# Patient Record
Sex: Female | Born: 1955
Health system: Southern US, Community
[De-identification: ages and names within clinical notes are randomized; demographics above are authoritative.]

## PROBLEM LIST (undated history)

## (undated) DIAGNOSIS — K219 Gastro-esophageal reflux disease without esophagitis: Secondary | ICD-10-CM

## (undated) DIAGNOSIS — I10 Essential (primary) hypertension: Secondary | ICD-10-CM

## (undated) DIAGNOSIS — J45909 Unspecified asthma, uncomplicated: Secondary | ICD-10-CM

## (undated) DIAGNOSIS — T7840XA Allergy, unspecified, initial encounter: Secondary | ICD-10-CM

## (undated) HISTORY — DX: Unspecified asthma, uncomplicated: J45.909

## (undated) HISTORY — DX: Allergy, unspecified, initial encounter: T78.40XA

## (undated) HISTORY — PX: BRAIN SURGERY: SHX531

## (undated) HISTORY — DX: Gastro-esophageal reflux disease without esophagitis: K21.9

---

## 2007-12-15 ENCOUNTER — Other Ambulatory Visit: Admission: RE | Admit: 2007-12-15 | Discharge: 2007-12-15 | Payer: Self-pay | Admitting: Internal Medicine

## 2010-11-19 ENCOUNTER — Encounter: Payer: Self-pay | Admitting: Internal Medicine

## 2014-09-01 ENCOUNTER — Other Ambulatory Visit: Payer: Self-pay | Admitting: Otolaryngology

## 2014-09-01 DIAGNOSIS — D170 Benign lipomatous neoplasm of skin and subcutaneous tissue of head, face and neck: Secondary | ICD-10-CM

## 2014-09-07 ENCOUNTER — Other Ambulatory Visit: Payer: Self-pay

## 2014-09-08 ENCOUNTER — Ambulatory Visit
Admission: RE | Admit: 2014-09-08 | Discharge: 2014-09-08 | Disposition: A | Discharge status: Home / Self Care | Payer: 59 | Source: Ambulatory Visit | Attending: Otolaryngology | Admitting: Otolaryngology

## 2014-09-08 DIAGNOSIS — D170 Benign lipomatous neoplasm of skin and subcutaneous tissue of head, face and neck: Secondary | ICD-10-CM

## 2014-09-08 MED ORDER — IOHEXOL 300 MG/ML  SOLN
75.0000 mL | Freq: Once | INTRAMUSCULAR | Status: AC | PRN
  Administered 2014-09-08: 75 mL via INTRAVENOUS

## 2015-12-02 ENCOUNTER — Other Ambulatory Visit: Payer: Self-pay | Admitting: Physical Medicine and Rehabilitation

## 2015-12-02 DIAGNOSIS — M5417 Radiculopathy, lumbosacral region: Secondary | ICD-10-CM

## 2015-12-08 ENCOUNTER — Ambulatory Visit
Admission: RE | Admit: 2015-12-08 | Discharge: 2015-12-08 | Disposition: A | Discharge status: Home / Self Care | Payer: 59 | Source: Ambulatory Visit | Attending: Physical Medicine and Rehabilitation | Admitting: Physical Medicine and Rehabilitation

## 2015-12-08 DIAGNOSIS — M5417 Radiculopathy, lumbosacral region: Secondary | ICD-10-CM

## 2016-03-05 DIAGNOSIS — M5416 Radiculopathy, lumbar region: Secondary | ICD-10-CM | POA: Diagnosis not present

## 2016-03-13 DIAGNOSIS — M5416 Radiculopathy, lumbar region: Secondary | ICD-10-CM | POA: Diagnosis not present

## 2016-03-22 DIAGNOSIS — M47816 Spondylosis without myelopathy or radiculopathy, lumbar region: Secondary | ICD-10-CM | POA: Diagnosis not present

## 2016-03-22 DIAGNOSIS — M4316 Spondylolisthesis, lumbar region: Secondary | ICD-10-CM | POA: Diagnosis not present

## 2016-03-22 DIAGNOSIS — M5126 Other intervertebral disc displacement, lumbar region: Secondary | ICD-10-CM | POA: Diagnosis not present

## 2016-03-29 DIAGNOSIS — M5416 Radiculopathy, lumbar region: Secondary | ICD-10-CM | POA: Diagnosis not present

## 2016-05-03 DIAGNOSIS — M5416 Radiculopathy, lumbar region: Secondary | ICD-10-CM | POA: Diagnosis not present

## 2016-05-07 DIAGNOSIS — M47816 Spondylosis without myelopathy or radiculopathy, lumbar region: Secondary | ICD-10-CM | POA: Diagnosis not present

## 2016-05-07 DIAGNOSIS — M5126 Other intervertebral disc displacement, lumbar region: Secondary | ICD-10-CM | POA: Diagnosis not present

## 2016-05-07 DIAGNOSIS — M4316 Spondylolisthesis, lumbar region: Secondary | ICD-10-CM | POA: Diagnosis not present

## 2016-05-07 DIAGNOSIS — Z6833 Body mass index (BMI) 33.0-33.9, adult: Secondary | ICD-10-CM | POA: Diagnosis not present

## 2016-05-15 DIAGNOSIS — M5416 Radiculopathy, lumbar region: Secondary | ICD-10-CM | POA: Diagnosis not present

## 2016-05-24 DIAGNOSIS — M5416 Radiculopathy, lumbar region: Secondary | ICD-10-CM | POA: Diagnosis not present

## 2016-06-12 DIAGNOSIS — M5416 Radiculopathy, lumbar region: Secondary | ICD-10-CM | POA: Diagnosis not present

## 2016-07-19 DIAGNOSIS — M47816 Spondylosis without myelopathy or radiculopathy, lumbar region: Secondary | ICD-10-CM | POA: Diagnosis not present

## 2016-07-19 DIAGNOSIS — M5416 Radiculopathy, lumbar region: Secondary | ICD-10-CM | POA: Diagnosis not present

## 2016-07-19 DIAGNOSIS — M4726 Other spondylosis with radiculopathy, lumbar region: Secondary | ICD-10-CM | POA: Diagnosis not present

## 2016-07-19 DIAGNOSIS — M4316 Spondylolisthesis, lumbar region: Secondary | ICD-10-CM | POA: Diagnosis not present

## 2016-07-23 DIAGNOSIS — I10 Essential (primary) hypertension: Secondary | ICD-10-CM | POA: Diagnosis not present

## 2016-08-23 DIAGNOSIS — Z1231 Encounter for screening mammogram for malignant neoplasm of breast: Secondary | ICD-10-CM | POA: Diagnosis not present

## 2017-01-29 DIAGNOSIS — J22 Unspecified acute lower respiratory infection: Secondary | ICD-10-CM | POA: Diagnosis not present

## 2018-05-23 DIAGNOSIS — M4316 Spondylolisthesis, lumbar region: Secondary | ICD-10-CM | POA: Diagnosis not present

## 2018-05-23 DIAGNOSIS — M47816 Spondylosis without myelopathy or radiculopathy, lumbar region: Secondary | ICD-10-CM | POA: Diagnosis not present

## 2018-05-23 DIAGNOSIS — M5416 Radiculopathy, lumbar region: Secondary | ICD-10-CM | POA: Diagnosis not present

## 2018-05-23 DIAGNOSIS — M5126 Other intervertebral disc displacement, lumbar region: Secondary | ICD-10-CM | POA: Diagnosis not present

## 2018-06-12 DIAGNOSIS — M5126 Other intervertebral disc displacement, lumbar region: Secondary | ICD-10-CM | POA: Diagnosis not present

## 2018-06-12 DIAGNOSIS — M4316 Spondylolisthesis, lumbar region: Secondary | ICD-10-CM | POA: Diagnosis not present

## 2018-06-12 DIAGNOSIS — M5416 Radiculopathy, lumbar region: Secondary | ICD-10-CM | POA: Diagnosis not present

## 2018-06-12 DIAGNOSIS — M47816 Spondylosis without myelopathy or radiculopathy, lumbar region: Secondary | ICD-10-CM | POA: Diagnosis not present

## 2018-06-26 DIAGNOSIS — M5126 Other intervertebral disc displacement, lumbar region: Secondary | ICD-10-CM | POA: Diagnosis not present

## 2018-07-10 DIAGNOSIS — M4726 Other spondylosis with radiculopathy, lumbar region: Secondary | ICD-10-CM | POA: Diagnosis not present

## 2018-07-10 DIAGNOSIS — M5416 Radiculopathy, lumbar region: Secondary | ICD-10-CM | POA: Diagnosis not present

## 2018-07-10 DIAGNOSIS — M4316 Spondylolisthesis, lumbar region: Secondary | ICD-10-CM | POA: Diagnosis not present

## 2018-07-10 DIAGNOSIS — M47816 Spondylosis without myelopathy or radiculopathy, lumbar region: Secondary | ICD-10-CM | POA: Diagnosis not present

## 2018-07-11 DIAGNOSIS — M5126 Other intervertebral disc displacement, lumbar region: Secondary | ICD-10-CM | POA: Diagnosis not present

## 2018-07-16 DIAGNOSIS — M5126 Other intervertebral disc displacement, lumbar region: Secondary | ICD-10-CM | POA: Diagnosis not present

## 2018-07-25 DIAGNOSIS — Z23 Encounter for immunization: Secondary | ICD-10-CM | POA: Diagnosis not present

## 2018-07-30 DIAGNOSIS — I10 Essential (primary) hypertension: Secondary | ICD-10-CM | POA: Diagnosis not present

## 2018-07-30 DIAGNOSIS — M5126 Other intervertebral disc displacement, lumbar region: Secondary | ICD-10-CM | POA: Diagnosis not present

## 2018-08-06 DIAGNOSIS — E6609 Other obesity due to excess calories: Secondary | ICD-10-CM | POA: Diagnosis not present

## 2018-08-06 DIAGNOSIS — I10 Essential (primary) hypertension: Secondary | ICD-10-CM | POA: Diagnosis not present

## 2018-08-06 DIAGNOSIS — E782 Mixed hyperlipidemia: Secondary | ICD-10-CM | POA: Diagnosis not present

## 2018-08-21 DIAGNOSIS — M4316 Spondylolisthesis, lumbar region: Secondary | ICD-10-CM | POA: Diagnosis not present

## 2018-08-21 DIAGNOSIS — M5126 Other intervertebral disc displacement, lumbar region: Secondary | ICD-10-CM | POA: Diagnosis not present

## 2018-08-21 DIAGNOSIS — M4726 Other spondylosis with radiculopathy, lumbar region: Secondary | ICD-10-CM | POA: Diagnosis not present

## 2018-08-21 DIAGNOSIS — M47816 Spondylosis without myelopathy or radiculopathy, lumbar region: Secondary | ICD-10-CM | POA: Diagnosis not present

## 2018-10-26 DIAGNOSIS — S161XXA Strain of muscle, fascia and tendon at neck level, initial encounter: Secondary | ICD-10-CM | POA: Diagnosis not present

## 2018-11-17 DIAGNOSIS — H2513 Age-related nuclear cataract, bilateral: Secondary | ICD-10-CM | POA: Diagnosis not present

## 2018-12-04 DIAGNOSIS — J029 Acute pharyngitis, unspecified: Secondary | ICD-10-CM | POA: Diagnosis not present

## 2018-12-04 DIAGNOSIS — R05 Cough: Secondary | ICD-10-CM | POA: Diagnosis not present

## 2019-01-07 DIAGNOSIS — I10 Essential (primary) hypertension: Secondary | ICD-10-CM | POA: Diagnosis not present

## 2019-01-07 DIAGNOSIS — E782 Mixed hyperlipidemia: Secondary | ICD-10-CM | POA: Diagnosis not present

## 2019-01-07 DIAGNOSIS — E6609 Other obesity due to excess calories: Secondary | ICD-10-CM | POA: Diagnosis not present

## 2019-01-07 DIAGNOSIS — Z131 Encounter for screening for diabetes mellitus: Secondary | ICD-10-CM | POA: Diagnosis not present

## 2019-02-17 DIAGNOSIS — J988 Other specified respiratory disorders: Secondary | ICD-10-CM | POA: Diagnosis not present

## 2019-02-27 DIAGNOSIS — J988 Other specified respiratory disorders: Secondary | ICD-10-CM | POA: Diagnosis not present

## 2019-02-27 DIAGNOSIS — I10 Essential (primary) hypertension: Secondary | ICD-10-CM | POA: Diagnosis not present

## 2019-02-27 DIAGNOSIS — R0602 Shortness of breath: Secondary | ICD-10-CM | POA: Diagnosis not present

## 2019-03-03 DIAGNOSIS — Z7189 Other specified counseling: Secondary | ICD-10-CM | POA: Diagnosis not present

## 2019-03-10 DIAGNOSIS — R0602 Shortness of breath: Secondary | ICD-10-CM | POA: Diagnosis not present

## 2019-03-10 DIAGNOSIS — J988 Other specified respiratory disorders: Secondary | ICD-10-CM | POA: Diagnosis not present

## 2019-03-10 DIAGNOSIS — Z7189 Other specified counseling: Secondary | ICD-10-CM | POA: Diagnosis not present

## 2019-03-10 DIAGNOSIS — I10 Essential (primary) hypertension: Secondary | ICD-10-CM | POA: Diagnosis not present

## 2019-03-17 DIAGNOSIS — J111 Influenza due to unidentified influenza virus with other respiratory manifestations: Secondary | ICD-10-CM | POA: Diagnosis not present

## 2019-03-17 DIAGNOSIS — I10 Essential (primary) hypertension: Secondary | ICD-10-CM | POA: Diagnosis not present

## 2019-03-17 DIAGNOSIS — J329 Chronic sinusitis, unspecified: Secondary | ICD-10-CM | POA: Diagnosis not present

## 2019-03-17 DIAGNOSIS — Z20828 Contact with and (suspected) exposure to other viral communicable diseases: Secondary | ICD-10-CM | POA: Diagnosis not present

## 2019-03-17 DIAGNOSIS — R05 Cough: Secondary | ICD-10-CM | POA: Diagnosis not present

## 2019-03-18 DIAGNOSIS — Z20828 Contact with and (suspected) exposure to other viral communicable diseases: Secondary | ICD-10-CM | POA: Diagnosis not present

## 2019-05-26 ENCOUNTER — Other Ambulatory Visit: Payer: Self-pay | Admitting: Family

## 2019-05-26 ENCOUNTER — Other Ambulatory Visit: Payer: Self-pay

## 2019-05-26 DIAGNOSIS — Z20822 Contact with and (suspected) exposure to covid-19: Secondary | ICD-10-CM

## 2019-05-26 DIAGNOSIS — R6889 Other general symptoms and signs: Secondary | ICD-10-CM | POA: Diagnosis not present

## 2019-05-28 ENCOUNTER — Other Ambulatory Visit: Payer: Self-pay

## 2019-05-28 DIAGNOSIS — R6889 Other general symptoms and signs: Secondary | ICD-10-CM | POA: Diagnosis not present

## 2019-05-28 DIAGNOSIS — Z20822 Contact with and (suspected) exposure to covid-19: Secondary | ICD-10-CM

## 2019-05-28 LAB — NOVEL CORONAVIRUS, NAA: SARS-CoV-2, NAA: NOT DETECTED

## 2019-05-30 LAB — NOVEL CORONAVIRUS, NAA: SARS-CoV-2, NAA: NOT DETECTED

## 2019-06-26 DIAGNOSIS — Z23 Encounter for immunization: Secondary | ICD-10-CM | POA: Diagnosis not present

## 2019-09-11 ENCOUNTER — Other Ambulatory Visit: Payer: Self-pay

## 2019-09-11 DIAGNOSIS — Z20822 Contact with and (suspected) exposure to covid-19: Secondary | ICD-10-CM

## 2019-09-14 LAB — NOVEL CORONAVIRUS, NAA: SARS-CoV-2, NAA: NOT DETECTED

## 2019-10-09 ENCOUNTER — Other Ambulatory Visit: Payer: Self-pay | Admitting: Internal Medicine

## 2019-10-09 DIAGNOSIS — Z1231 Encounter for screening mammogram for malignant neoplasm of breast: Secondary | ICD-10-CM

## 2020-02-10 DIAGNOSIS — Z0000 Encounter for general adult medical examination without abnormal findings: Secondary | ICD-10-CM | POA: Diagnosis not present

## 2020-02-10 DIAGNOSIS — N390 Urinary tract infection, site not specified: Secondary | ICD-10-CM | POA: Diagnosis not present

## 2020-02-10 DIAGNOSIS — E7800 Pure hypercholesterolemia, unspecified: Secondary | ICD-10-CM | POA: Diagnosis not present

## 2020-02-19 DIAGNOSIS — I10 Essential (primary) hypertension: Secondary | ICD-10-CM | POA: Diagnosis not present

## 2020-02-19 DIAGNOSIS — Z0000 Encounter for general adult medical examination without abnormal findings: Secondary | ICD-10-CM | POA: Diagnosis not present

## 2020-02-19 DIAGNOSIS — R430 Anosmia: Secondary | ICD-10-CM | POA: Diagnosis not present

## 2020-02-19 DIAGNOSIS — E782 Mixed hyperlipidemia: Secondary | ICD-10-CM | POA: Diagnosis not present

## 2020-02-19 DIAGNOSIS — E6609 Other obesity due to excess calories: Secondary | ICD-10-CM | POA: Diagnosis not present

## 2020-02-19 DIAGNOSIS — F5101 Primary insomnia: Secondary | ICD-10-CM | POA: Diagnosis not present

## 2020-07-22 DIAGNOSIS — Z23 Encounter for immunization: Secondary | ICD-10-CM | POA: Diagnosis not present

## 2020-08-09 DIAGNOSIS — I10 Essential (primary) hypertension: Secondary | ICD-10-CM | POA: Diagnosis not present

## 2020-10-03 DIAGNOSIS — R519 Headache, unspecified: Secondary | ICD-10-CM | POA: Diagnosis not present

## 2020-10-03 DIAGNOSIS — R110 Nausea: Secondary | ICD-10-CM | POA: Diagnosis not present

## 2020-10-03 DIAGNOSIS — J029 Acute pharyngitis, unspecified: Secondary | ICD-10-CM | POA: Diagnosis not present

## 2020-10-03 DIAGNOSIS — R0981 Nasal congestion: Secondary | ICD-10-CM | POA: Diagnosis not present

## 2020-10-07 ENCOUNTER — Inpatient Hospital Stay (HOSPITAL_COMMUNITY)
Admission: EM | Admit: 2020-10-07 | Discharge: 2020-10-13 | DRG: 614 | Disposition: A | Discharge status: Home / Self Care | Payer: BC Managed Care – PPO | Attending: Neurological Surgery | Admitting: Neurological Surgery

## 2020-10-07 ENCOUNTER — Encounter (HOSPITAL_COMMUNITY): Payer: Self-pay | Admitting: Emergency Medicine

## 2020-10-07 ENCOUNTER — Other Ambulatory Visit: Payer: Self-pay

## 2020-10-07 ENCOUNTER — Emergency Department (HOSPITAL_COMMUNITY): Payer: BC Managed Care – PPO

## 2020-10-07 DIAGNOSIS — R1110 Vomiting, unspecified: Secondary | ICD-10-CM | POA: Diagnosis not present

## 2020-10-07 DIAGNOSIS — D352 Benign neoplasm of pituitary gland: Secondary | ICD-10-CM | POA: Diagnosis present

## 2020-10-07 DIAGNOSIS — E876 Hypokalemia: Secondary | ICD-10-CM | POA: Diagnosis not present

## 2020-10-07 DIAGNOSIS — Z20822 Contact with and (suspected) exposure to covid-19: Secondary | ICD-10-CM | POA: Diagnosis present

## 2020-10-07 DIAGNOSIS — E2749 Other adrenocortical insufficiency: Secondary | ICD-10-CM | POA: Diagnosis present

## 2020-10-07 DIAGNOSIS — J3489 Other specified disorders of nose and nasal sinuses: Secondary | ICD-10-CM | POA: Diagnosis not present

## 2020-10-07 DIAGNOSIS — Z881 Allergy status to other antibiotic agents status: Secondary | ICD-10-CM

## 2020-10-07 DIAGNOSIS — H2513 Age-related nuclear cataract, bilateral: Secondary | ICD-10-CM | POA: Diagnosis not present

## 2020-10-07 DIAGNOSIS — Z01818 Encounter for other preprocedural examination: Secondary | ICD-10-CM | POA: Diagnosis not present

## 2020-10-07 DIAGNOSIS — K219 Gastro-esophageal reflux disease without esophagitis: Secondary | ICD-10-CM | POA: Diagnosis not present

## 2020-10-07 DIAGNOSIS — E222 Syndrome of inappropriate secretion of antidiuretic hormone: Secondary | ICD-10-CM | POA: Diagnosis present

## 2020-10-07 DIAGNOSIS — R519 Headache, unspecified: Secondary | ICD-10-CM | POA: Diagnosis not present

## 2020-10-07 DIAGNOSIS — E236 Other disorders of pituitary gland: Principal | ICD-10-CM | POA: Diagnosis present

## 2020-10-07 DIAGNOSIS — G9389 Other specified disorders of brain: Secondary | ICD-10-CM | POA: Diagnosis not present

## 2020-10-07 DIAGNOSIS — I10 Essential (primary) hypertension: Secondary | ICD-10-CM | POA: Diagnosis present

## 2020-10-07 DIAGNOSIS — Z885 Allergy status to narcotic agent status: Secondary | ICD-10-CM | POA: Diagnosis not present

## 2020-10-07 DIAGNOSIS — H53469 Homonymous bilateral field defects, unspecified side: Secondary | ICD-10-CM | POA: Diagnosis not present

## 2020-10-07 DIAGNOSIS — J342 Deviated nasal septum: Secondary | ICD-10-CM | POA: Diagnosis not present

## 2020-10-07 DIAGNOSIS — H5347 Heteronymous bilateral field defects: Secondary | ICD-10-CM | POA: Diagnosis present

## 2020-10-07 DIAGNOSIS — E871 Hypo-osmolality and hyponatremia: Secondary | ICD-10-CM | POA: Diagnosis not present

## 2020-10-07 DIAGNOSIS — E230 Hypopituitarism: Secondary | ICD-10-CM | POA: Diagnosis not present

## 2020-10-07 DIAGNOSIS — G939 Disorder of brain, unspecified: Secondary | ICD-10-CM | POA: Diagnosis not present

## 2020-10-07 DIAGNOSIS — E860 Dehydration: Secondary | ICD-10-CM | POA: Diagnosis not present

## 2020-10-07 DIAGNOSIS — G2581 Restless legs syndrome: Secondary | ICD-10-CM | POA: Diagnosis present

## 2020-10-07 DIAGNOSIS — J323 Chronic sphenoidal sinusitis: Secondary | ICD-10-CM | POA: Diagnosis not present

## 2020-10-07 DIAGNOSIS — H538 Other visual disturbances: Secondary | ICD-10-CM | POA: Diagnosis not present

## 2020-10-07 HISTORY — DX: Essential (primary) hypertension: I10

## 2020-10-07 LAB — HEPATIC FUNCTION PANEL
ALT: 33 U/L (ref 0–44)
AST: 51 U/L — ABNORMAL HIGH (ref 15–41)
Albumin: 4.3 g/dL (ref 3.5–5.0)
Alkaline Phosphatase: 71 U/L (ref 38–126)
Bilirubin, Direct: 0.3 mg/dL — ABNORMAL HIGH (ref 0.0–0.2)
Indirect Bilirubin: 0.8 mg/dL (ref 0.3–0.9)
Total Bilirubin: 1.1 mg/dL (ref 0.3–1.2)
Total Protein: 7.4 g/dL (ref 6.5–8.1)

## 2020-10-07 LAB — CBC
HCT: 36.6 % (ref 36.0–46.0)
Hemoglobin: 13.7 g/dL (ref 12.0–15.0)
MCH: 30.4 pg (ref 26.0–34.0)
MCHC: 37.4 g/dL — ABNORMAL HIGH (ref 30.0–36.0)
MCV: 81.2 fL (ref 80.0–100.0)
Platelets: 268 10*3/uL (ref 150–400)
RBC: 4.51 MIL/uL (ref 3.87–5.11)
RDW: 12.4 % (ref 11.5–15.5)
WBC: 5.4 10*3/uL (ref 4.0–10.5)
nRBC: 0 % (ref 0.0–0.2)

## 2020-10-07 LAB — BASIC METABOLIC PANEL
Anion gap: 15 (ref 5–15)
Anion gap: 14 (ref 5–15)
BUN: 10 mg/dL (ref 8–23)
BUN: 10 mg/dL (ref 8–23)
CO2: 23 mmol/L (ref 22–32)
CO2: 21 mmol/L — ABNORMAL LOW (ref 22–32)
Calcium: 9.4 mg/dL (ref 8.9–10.3)
Calcium: 9.3 mg/dL (ref 8.9–10.3)
Chloride: 77 mmol/L — ABNORMAL LOW (ref 98–111)
Chloride: 79 mmol/L — ABNORMAL LOW (ref 98–111)
Creatinine, Ser: 0.84 mg/dL (ref 0.44–1.00)
Creatinine, Ser: 0.76 mg/dL (ref 0.44–1.00)
GFR, Estimated: >60 mL/min (ref >60)
GFR, Estimated: >60 mL/min (ref >60)
Glucose, Bld: 98 mg/dL (ref 70–99)
Glucose, Bld: 81 mg/dL (ref 70–99)
Potassium: 3.1 mmol/L — ABNORMAL LOW (ref 3.5–5.1)
Potassium: 3.2 mmol/L — ABNORMAL LOW (ref 3.5–5.1)
Sodium: 115 mmol/L — CL (ref 135–145)
Sodium: 114 mmol/L — CL (ref 135–145)

## 2020-10-07 LAB — RESP PANEL BY RT-PCR (FLU A&B, COVID) ARPGX2
Influenza A by PCR: NEGATIVE
Influenza B by PCR: NEGATIVE
SARS Coronavirus 2 by RT PCR: NEGATIVE

## 2020-10-07 LAB — MAGNESIUM: Magnesium: 1.8 mg/dL (ref 1.7–2.4)

## 2020-10-07 MED ORDER — LORAZEPAM 2 MG/ML IJ SOLN
0.5000 mg | INTRAMUSCULAR | Status: AC
  Administered 2020-10-07: 0.5 mg via INTRAVENOUS
  Filled 2020-10-07: qty 1

## 2020-10-07 MED ORDER — POTASSIUM CHLORIDE 10 MEQ/100ML IV SOLN
10.0000 meq | INTRAVENOUS | Status: AC
  Administered 2020-10-07 – 2020-10-08 (×3): 10 meq via INTRAVENOUS
  Filled 2020-10-07 (×3): qty 100

## 2020-10-07 NOTE — Progress Notes (Signed)
Unable to complete pt MRI due to continuous leg and torso movement. Pt was given meds twice but was still unable to remain still. According to pt she has RLS and is unable to control her body movements.

## 2020-10-07 NOTE — ED Notes (Signed)
The pt returned from mri 

## 2020-10-07 NOTE — ED Notes (Signed)
Pt waiting for iv team so she can get her mri she needs medication for the same  And o

## 2020-10-07 NOTE — H&P (Signed)
NAME:  Rebecca Trevino, MRN:  [id number], DOB:  [birth year], LOS: 0 ADMISSION DATE:  10/07/2020, CONSULTATION DATE:  10/07/20  REFERRING MD:  Wyn Quaker, ED physician, CHIEF COMPLAINT:   Blurred vision x 2 weeks, headaches, nausea/vomiting  Brief History   64 yo woman with hx of HTN and GERD, here with  blurred vision x 2 weeks, headaches, nausea/vomiting, sent from [facility] due to bitemporal hemianopsia.  Found to have 2.9 cm round, intra-sellar mass with suprasellar extension.   History of present illness   Blurred vision x 2 weeks. Was thought to have sinus infection based on facial pain and pressure (no nasal congestion or rhinitis).  Amoxicillin was not helpful.   Seen at [facility] today for vision changes, exam notable for bitemporal hemianopsia so send to ED for MRI.    Was unable to complete mri, persistent leg and torso movement (Per notes has RLS and is unable to control movements), despite Ativan 0.5mg  IV x 2.    Na 115 initially --> 114.  K 3.2   Past Medical History  HTN  GERD  Significant Hospital Events     Consults:  Neurosurgery  Procedures:    Significant Diagnostic Tests:  MRI  1. Truncated exam demonstrates at 2.9 cm round, intra-sellar mass with suprasellar extension. Associated effaced suprasellar cistern and mass effect on the optic chiasm.  2. Favor Pituitary Macroadenoma at this point but recommend repeat MRI Head using Pituitary Protocol (without and with contrast) when the patient can tolerate for definitive imaging characterization and treatment planning.   Micro Data:    Antimicrobials:    Interim history/subjective:    Objective   Blood pressure (!) 123/92, pulse 81, temperature 97.8 F (36.6 C), temperature source Oral, resp. rate 17, height 5\' 6"  (1.676 m), weight 70.3 kg, SpO2 98 %.       No intake or output data in the 24 hours ending 10/07/20 2245 Filed Weights   10/07/20 1539  Weight: 70.3 kg     Examination: General: NAD, pleasant  HENT: perrl, EOMI, B peripheral vision decreased  Lungs: CTAB Cardiovascular: RRR no mgr  Abdomen: Nt, ND, NBS  Extremities: no edema, no tenderness, no erythema Neuro: non focal, strength symmetric in all extremities, CN grossly intact Sensation intact  Resolved Hospital Problem list     Assessment & Plan:  Intrasellar mass: suspicious for pituitary macroadenoma per radiology.   Begin initial work up: serum prolactin, insulin-like growth factor-1 (IGF-1) , and plasma corticotropin (ACTH) and  urinary free cortisol ,LH, FSH, T4, TSH.  NSG consulting Will likely need MRI done, but unable to stay still due to her RLS.  Seems that ativan made her symtpoms worse.    Hyponatremia: most likely 2/2 SIADH, could be from adrenal insufficiency.  Check cortisol, tsh.  Serum osm, urine osm.  Poor solute intake and mild dehydration could be contributing.  Urine studies pending.   Given symptoms (nausea/vomiting) will initiate treatment with 3% saline.   Check Na q2 initially, then q4.   Best practice (evaluated daily)   Diet: clears, Free water restriction Pain/Anxiety/Delirium protocol (if indicated):  VAP protocol (if indicated):  DVT prophylaxis: SCDs GI prophylaxis: pepcid Glucose control: Mobility:  last date of multidisciplinary goals of care discussion Family and staff present  Summary of discussion  Follow up goals of care discussion due Code Status: Full Code Disposition:  Labs   CBC: Recent Labs  Lab 10/07/20 1605  WBC 5.4  HGB 13.7  HCT 36.6  MCV 81.2  PLT 060    Basic Metabolic Panel: Recent Labs  Lab 10/07/20 1605 10/07/20 1831 10/07/20 1945  NA 115*  --  114*  K 3.1*  --  3.2*  CL 77*  --  79*  CO2 23  --  21*  GLUCOSE 98  --  81  BUN 10  --  10  CREATININE 0.84  --  0.76  CALCIUM 9.4  --  9.3  MG  --  1.8  --    GFR: Estimated Creatinine Clearance: 64.8 mL/min (by C-G formula based on SCr of 0.76  mg/dL). Recent Labs  Lab 10/07/20 1605  WBC 5.4    Liver Function Tests: Recent Labs  Lab 10/07/20 1831  AST 51*  ALT 33  ALKPHOS 71  BILITOT 1.1  PROT 7.4  ALBUMIN 4.3   No results for input(s): LIPASE, AMYLASE in the last 168 hours. No results for input(s): AMMONIA in the last 168 hours.  ABG No results found for: PHART, PCO2ART, PO2ART, HCO3, TCO2, ACIDBASEDEF, O2SAT   Coagulation Profile: No results for input(s): INR, PROTIME in the last 168 hours.  Cardiac Enzymes: No results for input(s): CKTOTAL, CKMB, CKMBINDEX, TROPONINI in the last 168 hours.  HbA1C: No results found for: HGBA1C  CBG: No results for input(s): GLUCAP in the last 168 hours.  Review of Systems:   Review of Systems  Constitutional: Positive for diaphoresis and malaise/fatigue. Negative for chills, fever and weight loss.  HENT: Positive for sinus pain. Negative for ear pain, hearing loss and tinnitus.   Eyes: Positive for blurred vision. Negative for double vision, photophobia, pain, discharge and redness.  Respiratory: Positive for stridor. Negative for cough, hemoptysis and sputum production.   Cardiovascular: Negative for chest pain and palpitations.  Gastrointestinal: Positive for nausea and vomiting. Negative for abdominal pain, blood in stool, constipation, diarrhea, heartburn and melena.  Genitourinary: Negative for dysuria and urgency.  Musculoskeletal: Negative for myalgias.  Skin: Negative for itching and rash.  Neurological: Positive for dizziness and headaches.  Endo/Heme/Allergies: Bruises/bleeds easily.  Psychiatric/Behavioral: Negative for depression.     Past Medical History  She,  has no past medical history on file.   Surgical History      Social History      Family History   Her family history is not on file.   Allergies Allergies  Allergen Reactions  . Codeine      Home Medications  Prior to Admission medications   Not on File     Critical care time:  35 minutes

## 2020-10-07 NOTE — ED Notes (Signed)
To mri 

## 2020-10-07 NOTE — ED Triage Notes (Signed)
Patient arrives to ED with complaints of blurred vision x2 weeks during a sinus infection. Pt went to [place] today dx with bitemporal hemianopia and was told she has a possible pititary tumor and is in need of an MRI for conformation.

## 2020-10-07 NOTE — ED Notes (Signed)
Patient states she just urinated before change of shift and will inform us when she can give specimen

## 2020-10-07 NOTE — ED Notes (Signed)
Pt still in mri  anothewr dose of ativan was given for mmvement

## 2020-10-07 NOTE — ED Provider Notes (Signed)
[facility] EMERGENCY DEPARTMENT Provider Note   CSN: [id number] Arrival date & time: 10/07/20  1533     History Chief Complaint  Patient presents with  . Blurred Vision    Rebecca Trevino is a 64 y.o. female with a past medical history of hypertension who presents today from her ophthalmologist for concern of a pituitary tumor.  She reports that about 2 weeks ago she began developing headache and pain around her eyes with nasal congestion.  She has been taking antibiotics for this.  She has been frequently using saline nose spray and using cough medicine.  She has no history of hyponatremia.  She denies any history of brain tumors. Over the past week she has noted blurry vision with light sensitivity it has been increasing since then.  She reports that she has had a headache constantly over the past 2 weeks and has been vomiting every day.  She has been taking about 3 L p.o. of water over the past few days for feelings of being dehydrated due to her vomiting.  She denies any fevers.  She states that she also feels off balance.  HPI     Past Medical History:  Diagnosis Date  . High blood pressure     Patient Active Problem List   Diagnosis Date Noted  . Hyponatremia 10/07/2020  . Brain mass 10/07/2020  . 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 No pertinent surgical history.   OB History   No obstetric history on file.     History reviewed. No pertinent family history.     Home Medications Prior to Admission medications   Not on File    Allergies    Codeine and Erythromycin  Review of Systems   Review of Systems  Constitutional: Negative for unexpected weight change.  HENT: Positive for congestion, rhinorrhea, sinus pressure and sinus pain. Negative for facial swelling, hearing loss, sore throat, tinnitus, trouble swallowing and voice change.   Eyes: Positive for photophobia and visual disturbance.  Respiratory: Negative for cough  and shortness of breath.   Cardiovascular: Negative for chest pain.  Gastrointestinal: Positive for nausea and vomiting. Negative for abdominal pain.  Musculoskeletal: Negative for back pain and neck pain.  Neurological: Positive for dizziness and headaches. Negative for speech difficulty and weakness.  All other systems reviewed and are negative.   Physical Exam Updated Vital Signs BP (!) 146/95   Pulse 92   Temp 97.8 F (36.6 C) (Oral)   Resp 13   Ht 5\' 6"  (1.676 m)   Wt 70.3 kg   SpO2 99%   BMI 25.02 kg/m   Physical Exam Vitals and nursing note reviewed.  Constitutional:      General: She is not in acute distress.    Appearance: She is well-developed and well-nourished.  HENT:     Head: Normocephalic and atraumatic.     Nose:     Comments: Rhinorrhea    Mouth/Throat:     Mouth: Mucous membranes are moist.  Eyes:     Conjunctiva/sclera: Conjunctivae normal.  Cardiovascular:     Rate and Rhythm: Normal rate and regular rhythm.     Pulses: Normal pulses.     Heart sounds: Normal heart sounds. No murmur heard.   Pulmonary:     Effort: Pulmonary effort is normal. No respiratory distress.     Breath sounds: Normal breath sounds.  Abdominal:     Palpations: Abdomen is soft.     Tenderness: There is  no abdominal tenderness.  Musculoskeletal:        General: No edema.     Cervical back: Normal range of motion and neck supple.     Right lower leg: No edema.     Left lower leg: No edema.  Skin:    General: Skin is warm and dry.  Neurological:     Mental Status: She is alert.     Cranial Nerves: Cranial nerve deficit present.     Coordination: Coordination abnormal.     Comments: Mental Status:  Alert, oriented, thought content appropriate, able to give a coherent history. Speech fluent without evidence of aphasia. Able to follow 2 step commands without difficulty.  Cranial Nerves:  II:  PEARL, See drawing by patient for missing visual fields.  III,IV, VI: ptosis  not present, extra-ocular motions intact bilaterally  V,VII: smile symmetric, facial light touch sensation equal VIII: hearing grossly normal to voice  X: uvula elevates symmetrically  XI: bilateral shoulder shrug symmetric and strong XII: midline tongue extension without fassiculations Motor:  Normal tone. 5/5 in upper and lower extremities bilaterally including strong and equal grip strength and dorsiflexion/plantar flexion Cerebellar: Mild difficulty bilaterally with finger nose finger.  Normal heal to toe/shin bilaterally.  Gait: normal gait and balance CV: distal pulses palpable throughout    Psychiatric:        Mood and Affect: Mood and affect and mood normal.        Behavior: Behavior normal.       Results from ophthalmology.        ED Results / Procedures / Treatments   Labs (all labs ordered are listed, but only abnormal results are displayed) Labs Reviewed  CBC - Abnormal; Notable for the following components:      Result Value   MCHC 37.4 (*)    All other components within normal limits  BASIC METABOLIC PANEL - Abnormal; Notable for the following components:   Sodium 115 (*)    Potassium 3.1 (*)    Chloride 77 (*)    All other components within normal limits  HEPATIC FUNCTION PANEL - Abnormal; Notable for the following components:   AST 51 (*)    Bilirubin, Direct 0.3 (*)    All other components within normal limits  BASIC METABOLIC PANEL - Abnormal; Notable for the following components:   Sodium 114 (*)    Potassium 3.2 (*)    Chloride 79 (*)    CO2 21 (*)    All other components within normal limits  RESP PANEL BY RT-PCR (FLU A&B, COVID) ARPGX2  MAGNESIUM  RAPID URINE DRUG SCREEN, HOSP PERFORMED  URINALYSIS, ROUTINE W REFLEX MICROSCOPIC  OSMOLALITY, URINE  SODIUM, URINE, RANDOM  ETHANOL  CREATININE, URINE, RANDOM    EKG EKG Interpretation  Date/Time:  Friday October 07 2020 18:57:27 EST Ventricular Rate:  80 PR Interval:    QRS  Duration: 108 QT Interval:  474 QTC Calculation: 547 R Axis:   78 Text Interpretation: Sinus rhythm Abnrm T, consider ischemia, anterolateral lds Prolonged QT interval Confirmed by Dene Gentry [phone]) on 10/07/2020 7:04:23 PM   Radiology MR BRAIN WO CONTRAST  Result Date: 10/07/2020 CLINICAL DATA:  64 year old female with bitemporal hemianopsia, headache and vomiting. EXAM: MRI HEAD WITHOUT CONTRAST TECHNIQUE: Multiplanar, multiecho pulse sequences of the brain and surrounding structures were obtained without intravenous contrast. COMPARISON:  None. FINDINGS: The examination had to be discontinued prior to completion due to patient inability to continue, despite two administrations of medication for  anxiolysis. Subsequently, only a partial noncontrast study of the brain was obtained with no T2 weighted imaging. Brain: Round intra sellar mass with suprasellar extension. Splaying of the ICA siphons at the level of the sella turcica. The lesion encompasses 23 x 22 x 29 mm (AP by transverse by CC) with effaced suprasellar cistern, including optic chiasm. The lesion is T1 isointense and FLAIR hyperintense to background brain, with diffusion restriction likely related to hypercellularity. No normal pituitary gland is identified. No superimposed restricted diffusion suggestive of acute infarction. No midline shift, ventriculomegaly, or acute intracranial hemorrhage. Cervicomedullary junction within normal limits. Scattered, patchy bilateral nonspecific cerebral white matter T2 and FLAIR hyperintensity. No cortical encephalomalacia identified. SWI provided with no chronic cerebral blood products identified. Skull and upper cervical spine: Visible bone marrow signal remains normal, including at the skull base. Negative visible cervical spine. Sinuses/Orbits: Hyperplastic sphenoid sinuses. Visualized paranasal sinuses and mastoids are well aerated. Grossly normal visible intraorbital soft tissues. IMPRESSION: 1.  Truncated exam demonstrates at 2.9 cm round, intra-sellar mass with suprasellar extension. Associated effaced suprasellar cistern and mass effect on the optic chiasm. 2. Favor Pituitary Macroadenoma at this point but recommend repeat MRI Head using Pituitary Protocol (without and with contrast) when the patient can tolerate for definitive imaging characterization and treatment planning. Electronically Signed   By: Genevie Ann M.D.   On: 10/07/2020 22:40    Procedures .Critical Care Performed by: Lorin Glass, PA-C Authorized by: Lorin Glass, PA-C   Critical care provider statement:    Critical care time (minutes):  45   Critical care was necessary to treat or prevent imminent or life-threatening deterioration of the following conditions:  Endocrine crisis and CNS failure or compromise   Critical care was time spent personally by me on the following activities:  Discussions with consultants, evaluation of patient's response to treatment, examination of patient, ordering and performing treatments and interventions, ordering and review of laboratory studies, ordering and review of radiographic studies, pulse oximetry, re-evaluation of patient's condition, obtaining history from patient or surrogate and review of old charts Comments:     Critical care admission to ICU for consideration of 3% saline   (including critical care time)  Medications Ordered in ED Medications  potassium chloride 10 mEq in 100 mL IVPB (10 mEq Intravenous New Bag/Given 10/07/20 2306)  LORazepam (ATIVAN) injection 0.5 mg (0.5 mg Intravenous Given 10/07/20 2034)  LORazepam (ATIVAN) injection 0.5 mg (0.5 mg Intravenous Given 10/07/20 2135)    ED Course  I have reviewed the triage vital signs and the nursing notes.  Pertinent labs & imaging results that were available during my care of the patient were reviewed by me and considered in my medical decision making (see chart for details).  Clinical Course as of  10/07/20 2331  Fri Oct 07, 2020  1761 I spoke with Dr. Leonel Ramsay of neurology who recommends MRI brain with and without contrast and putting in the comments pituitary protocol, in addition to MRI orbits with and without contrast. [EH]  2220 I was informed that patient was unable to tolerate MRI.  She became confused and was not re-directable.  I did view images and there appears to be a mass in the area of the pituitary.  Plan to consult critical care.  Anticipate patient will need anesthesia for MRI.  [EH]  2308 I spoke with Dr. Theda Sers of neurology who requested I contact neurosurgery instead.  [EH]  2313 Spoke with Dr. Venetia Constable of neurosurgery, he is aware  on patient.  Recommends treatment of the hyponatremia, obtaining imaging.  He states that he does not change care plans at this time.  [EH]  2321 Dr. Duwayne Heck of PCCM will see patient.  [EH]    Clinical Course User Index [EH] Ollen Gross   MDM Rules/Calculators/A&P                          Patient is a 64 year old woman who presents today for evaluation of a possible pituitary tumor.  She was referred from ophthalmology for bitemporal hemianopsia.  On exam she does appear slightly unsteady.  Here she has significant hyponatremia, initial BMP showed hyponatremia at 115, repeat is 114 suggesting this is accurate.  Her potassium is slightly low at 3.2, chloride is low at 49.  Covid test is negative.  CBC is unremarkable.  Hepatic function panel shows minimal transaminitis however is otherwise normal.  He is ordered.  After discussions with specially for appropriate imaging MRI brain and orbits both with and without contrast is ordered.  Was given 0.5 mg of Ativan for anxiety after which she would not stay still.  Additional 0.5 mg of Ativan was ordered however patient was unable to complete the MRI, was not redirectable.  After discussions with MRI tech I suspect that patient may need general anesthesia for MRI.  When patient  returned from MRI she was very fidgety, unable to sit still.    The partial imaging that was able to be obtained shows a 2.9 cm round intrasellar mass with suprasellar extension and mass-effect on the optic chiasm which is consistent with her bitemporal vision loss.  I discussed these results with patient and her husband who is at bedside.  Given the degree of hyponatremia in the setting of structural brain abnormality consideration for 3% saline. I called [facility] who will see the patient for admission.  I spoke with neurology who recommended this was more of a neurosurgical issue.  I spoke with Dr. Venetia Constable of neurosurgery who recommended treatment of electrolyte abnormalities, and obtaining imaging.  Kryslyn Helbig was evaluated in Emergency Department on 10/07/2020 for the symptoms described in the history of present illness. She was evaluated in the context of the global COVID-19 pandemic, which necessitated consideration that the patient might be at risk for infection with the SARS-CoV-2 virus that causes COVID-19. Institutional protocols and algorithms that pertain to the evaluation of patients at risk for COVID-19 are in a state of rapid change based on information released by regulatory bodies including the CDC and federal and state organizations. These policies and algorithms were followed during the patient's care in the ED.  Note: Portions of this report may have been transcribed using voice recognition software. Every effort was made to ensure accuracy; however, inadvertent computerized transcription errors may be present  Final Clinical Impression(s) / ED Diagnoses Final diagnoses:  Bitemporal hemianopia  Hyponatremia  Brain mass    Rx / DC Orders ED Discharge Orders    None       Lorin Glass, PA-C 10/07/20 2351    Valarie Merino, MD 10/11/20 1531

## 2020-10-08 ENCOUNTER — Inpatient Hospital Stay (HOSPITAL_COMMUNITY): Payer: BC Managed Care – PPO

## 2020-10-08 DIAGNOSIS — I10 Essential (primary) hypertension: Secondary | ICD-10-CM | POA: Diagnosis present

## 2020-10-08 DIAGNOSIS — Z885 Allergy status to narcotic agent status: Secondary | ICD-10-CM | POA: Diagnosis not present

## 2020-10-08 DIAGNOSIS — K219 Gastro-esophageal reflux disease without esophagitis: Secondary | ICD-10-CM | POA: Diagnosis present

## 2020-10-08 DIAGNOSIS — E236 Other disorders of pituitary gland: Secondary | ICD-10-CM | POA: Diagnosis present

## 2020-10-08 DIAGNOSIS — E2749 Other adrenocortical insufficiency: Secondary | ICD-10-CM | POA: Diagnosis present

## 2020-10-08 DIAGNOSIS — E230 Hypopituitarism: Secondary | ICD-10-CM | POA: Diagnosis present

## 2020-10-08 DIAGNOSIS — G2581 Restless legs syndrome: Secondary | ICD-10-CM | POA: Diagnosis present

## 2020-10-08 DIAGNOSIS — E222 Syndrome of inappropriate secretion of antidiuretic hormone: Secondary | ICD-10-CM | POA: Diagnosis present

## 2020-10-08 DIAGNOSIS — H5347 Heteronymous bilateral field defects: Secondary | ICD-10-CM | POA: Diagnosis present

## 2020-10-08 DIAGNOSIS — D352 Benign neoplasm of pituitary gland: Secondary | ICD-10-CM

## 2020-10-08 DIAGNOSIS — E871 Hypo-osmolality and hyponatremia: Secondary | ICD-10-CM | POA: Diagnosis not present

## 2020-10-08 DIAGNOSIS — H538 Other visual disturbances: Secondary | ICD-10-CM | POA: Diagnosis present

## 2020-10-08 DIAGNOSIS — E860 Dehydration: Secondary | ICD-10-CM | POA: Diagnosis present

## 2020-10-08 DIAGNOSIS — Z20822 Contact with and (suspected) exposure to covid-19: Secondary | ICD-10-CM | POA: Diagnosis present

## 2020-10-08 DIAGNOSIS — E876 Hypokalemia: Secondary | ICD-10-CM | POA: Diagnosis present

## 2020-10-08 DIAGNOSIS — G9389 Other specified disorders of brain: Secondary | ICD-10-CM | POA: Diagnosis not present

## 2020-10-08 DIAGNOSIS — Z881 Allergy status to other antibiotic agents status: Secondary | ICD-10-CM | POA: Diagnosis not present

## 2020-10-08 LAB — URINALYSIS, ROUTINE W REFLEX MICROSCOPIC
Bacteria, UA: NONE SEEN
Bilirubin Urine: NEGATIVE
Glucose, UA: NEGATIVE mg/dL
Ketones, ur: 20 mg/dL — AB
Leukocytes,Ua: NEGATIVE
Nitrite: NEGATIVE
Protein, ur: NEGATIVE mg/dL
Specific Gravity, Urine: 1.006 (ref 1.005–1.030)
pH: 5 (ref 5.0–8.0)

## 2020-10-08 LAB — OSMOLALITY, URINE: Osmolality, Ur: 237 mOsm/kg — ABNORMAL LOW (ref 300–900)

## 2020-10-08 LAB — SODIUM
Sodium: 115 mmol/L — CL (ref 135–145)
Sodium: 119 mmol/L — CL (ref 135–145)
Sodium: 120 mmol/L — ABNORMAL LOW (ref 135–145)
Sodium: 123 mmol/L — ABNORMAL LOW (ref 135–145)
Sodium: 119 mmol/L — CL (ref 135–145)
Sodium: 125 mmol/L — ABNORMAL LOW (ref 135–145)

## 2020-10-08 LAB — T4, FREE: Free T4: 0.55 ng/dL — ABNORMAL LOW (ref 0.61–1.12)

## 2020-10-08 LAB — RAPID URINE DRUG SCREEN, HOSP PERFORMED
Amphetamines: NOT DETECTED
Barbiturates: NOT DETECTED
Benzodiazepines: NOT DETECTED
Cocaine: NOT DETECTED
Opiates: NOT DETECTED
Tetrahydrocannabinol: NOT DETECTED

## 2020-10-08 LAB — GLUCOSE, CAPILLARY
Glucose-Capillary: 88 mg/dL (ref 70–99)
Glucose-Capillary: 75 mg/dL (ref 70–99)
Glucose-Capillary: 93 mg/dL (ref 70–99)
Glucose-Capillary: 168 mg/dL — ABNORMAL HIGH (ref 70–99)

## 2020-10-08 LAB — TSH: TSH: 0.488 u[IU]/mL (ref 0.350–4.500)

## 2020-10-08 LAB — CREATININE, URINE, RANDOM: Creatinine, Urine: 47.52 mg/dL

## 2020-10-08 LAB — OSMOLALITY: Osmolality: 245 mOsm/kg — CL (ref 275–295)

## 2020-10-08 LAB — ETHANOL: Alcohol, Ethyl (B): <10 mg/dL (ref <10)

## 2020-10-08 LAB — CORTISOL: Cortisol, Plasma: 1.4 ug/dL

## 2020-10-08 LAB — HIV ANTIBODY (ROUTINE TESTING W REFLEX): HIV Screen 4th Generation wRfx: NONREACTIVE

## 2020-10-08 LAB — MRSA PCR SCREENING: MRSA by PCR: NEGATIVE

## 2020-10-08 LAB — SODIUM, URINE, RANDOM: Sodium, Ur: <10 mmol/L

## 2020-10-08 MED ORDER — GADOBUTROL 1 MMOL/ML IV SOLN
4.0000 mL | Freq: Once | INTRAVENOUS | Status: AC | PRN
  Administered 2020-10-08: 4 mL via INTRAVENOUS

## 2020-10-08 MED ORDER — HYDROCORTISONE NA SUCCINATE PF 100 MG IJ SOLR
50.0000 mg | Freq: Three times a day (TID) | INTRAMUSCULAR | Status: AC
  Administered 2020-10-08 – 2020-10-09 (×3): 50 mg via INTRAVENOUS
  Filled 2020-10-08 (×3): qty 2

## 2020-10-08 MED ORDER — LEVOTHYROXINE SODIUM 50 MCG PO TABS
50.0000 ug | ORAL_TABLET | Freq: Every day | ORAL | Status: DC
  Administered 2020-10-09 – 2020-10-13 (×4): 50 ug via ORAL
  Filled 2020-10-08 (×5): qty 1

## 2020-10-08 MED ORDER — FAMOTIDINE 20 MG PO TABS
20.0000 mg | ORAL_TABLET | Freq: Two times a day (BID) | ORAL | Status: DC
  Administered 2020-10-08 – 2020-10-13 (×10): 20 mg via ORAL
  Filled 2020-10-08 (×10): qty 1

## 2020-10-08 MED ORDER — DEXAMETHASONE SODIUM PHOSPHATE 10 MG/ML IJ SOLN
6.0000 mg | Freq: Once | INTRAMUSCULAR | Status: AC
  Administered 2020-10-08: 6 mg via INTRAVENOUS
  Filled 2020-10-08: qty 1

## 2020-10-08 MED ORDER — PREDNISONE 20 MG PO TABS
10.0000 mg | ORAL_TABLET | Freq: Every day | ORAL | Status: DC
  Filled 2020-10-08: qty 1

## 2020-10-08 MED ORDER — CHLORHEXIDINE GLUCONATE CLOTH 2 % EX PADS
6.0000 | MEDICATED_PAD | Freq: Every day | CUTANEOUS | Status: DC
  Administered 2020-10-08 – 2020-10-12 (×4): 6 via TOPICAL

## 2020-10-08 MED ORDER — SODIUM CHLORIDE 3 % IV SOLN
INTRAVENOUS | Status: DC
  Filled 2020-10-08: qty 500

## 2020-10-08 MED ORDER — SODIUM CHLORIDE 0.9 % IV BOLUS
1000.0000 mL | Freq: Once | INTRAVENOUS | Status: AC
  Administered 2020-10-08: 1000 mL via INTRAVENOUS

## 2020-10-08 MED ORDER — HYDROCORTISONE NA SUCCINATE PF 100 MG IJ SOLR: 50.0000 mg | Freq: Three times a day (TID) | INTRAMUSCULAR | Status: DC

## 2020-10-08 MED ORDER — MELATONIN 3 MG PO TABS
3.0000 mg | ORAL_TABLET | Freq: Every evening | ORAL | Status: AC | PRN
Start: 2020-10-08 — End: 2020-10-11
  Administered 2020-10-08 – 2020-10-09 (×2): 3 mg via ORAL
  Filled 2020-10-08 (×3): qty 1

## 2020-10-08 MED ORDER — PREDNISONE 5 MG PO TABS: 5.0000 mg | ORAL_TABLET | Freq: Every day | ORAL | Status: DC

## 2020-10-08 MED ORDER — POLYETHYLENE GLYCOL 3350 17 G PO PACK: 17.0000 g | PACK | Freq: Every day | ORAL | Status: DC | PRN

## 2020-10-08 MED ORDER — DOCUSATE SODIUM 100 MG PO CAPS: 100.0000 mg | ORAL_CAPSULE | Freq: Two times a day (BID) | ORAL | Status: DC | PRN

## 2020-10-08 MED ORDER — ONDANSETRON HCL 4 MG/2ML IJ SOLN: 4.0000 mg | Freq: Four times a day (QID) | INTRAMUSCULAR | Status: DC | PRN

## 2020-10-08 NOTE — Progress Notes (Signed)
CRITICAL VALUE ALERT  Critical Value:  119 Na  Date & Time Notied:  10/08/20 @ 3254  Provider Notified: Loanne Drilling, MD  Orders Received/Actions taken: Awaiting new orders.

## 2020-10-08 NOTE — Progress Notes (Signed)
[facility] Progress Note Patient Name: Rebecca Trevino DOB: 09-Oct-1956 MRN: [id number]   Date of Service  10/08/2020  HPI/Events of Note  Patient is requesting a sleep aid, she is alert and oriented. She has taken Melatonin in the past.  eICU Interventions  Melatonin 3 mg po Q HS prn insomnia ordered.        Rebecca Trevino 10/08/2020, 9:51 PM

## 2020-10-08 NOTE — Progress Notes (Signed)
CRITICAL VALUE ALERT  Critical Value:  Sodium 115/Osmolality 245   Date & Time Notied: 10/08/20 0212  Provider Notified: Warren Lacy  Orders Received/Actions taken: no new orders

## 2020-10-08 NOTE — Consult Note (Signed)
Neurosurgery Consultation  Reason for Consult: Sellar mass Referring Physician: Loanne Drilling  CC: Blurry vision  HPI: This is a 64 y.o. woman that presents with 2 weeks of headaches, N/V, sent to the ED by her ophthalmologist due to finding of bitemporal hemianopsia. Due to the vomiting and increased thirst, has been drinking a fair bit of free water the past few days. She thought she had a sinus infection given all of the retro-orbital pain and started ABx but never had any nasal discharge or fever. Headache was not acute onset / thunderclap / WHOL, no clear inciting event. She denies any diplopia. No recent use of anti-platelet or anti-coagulant medications. Admission labs notable for ketonuria, Na of 114, pit panel WNL except FT4 of 0.55, random cort of 1.4, serum osm of 245, but still pending multiple results including PRL.    ROS: A 14 point ROS was performed and is negative except as noted in the HPI.   PMHx:  Past Medical History:  Diagnosis Date  . High blood pressure    FamHx: History reviewed. No pertinent family history. SocHx:  has no history on file for tobacco use, alcohol use, and drug use.  Exam: Vital signs in last 24 hours: Temp:  [97.6 F (36.4 C)-98.4 F (36.9 C)] 98.4 F (36.9 C) (12/11 0430) Pulse Rate:  [58-93] 93 (12/11 0700) Resp:  [13-60] 15 (12/11 0700) BP: (96-146)/(57-95) 136/76 (12/11 0700) SpO2:  [91 %-100 %] 99 % (12/11 0700) Weight:  [69.9 kg-70.3 kg] 69.9 kg (12/11 0430) General: Awake, alert, cooperative, lying in bed in NAD Head: Normocephalic and atruamatic HEENT: Neck supple Pulmonary: breathing room air comfortably, no evidence of increased work of breathing Cardiac: RRR Abdomen: S NT ND Extremities: Warm and well perfused x4 Neuro: AOx3, PERRL, EOMI, FS, visual fields with clear bitemporal hemianopsia OD>OS Strength 5/5 x4, SILTx4, no drift   Assessment and Plan: 64 y.o. woman woman with blurry vision, HA / N / V. MRI personally reviewed,  which is only a partial study and significantly limited by artifact. It shows a large sellar mass that is T1 iso and T2 mildly hyperintense, fairly homogeneous without fluid level, SWI has too much artifact to definitely say no hemorrhage / apoplexy.  -MRI sella protocol w/wo -hyponatremia in this setting appears multifactorial - likely partially hypovolemic given the vomiting and ketonuria, with some polydypsia and possibly a component of low GC output. Urinary / serum sodium from this morning show a low urine osm and urine Na, which is an appropriate physiologic response given her hyponatremia. SIADH due to pituitary adenoma has been described, but is uncommon and she would be expected to have a higher UNa. Agree with correction while we get more data to determine plan of care -if no hemorrhage on dedicated sella protocol, will await PRL to determine final treatment plan. If there is no apoplexy and it is a prolactinoma, will treat medically. Otherwise, will require surgical resection. Given the extent of her visual field loss, if she is stable this admission and we have enough lab work back, we discussed potential resection early next week. Will update on the plan as more information becomes available.   Judith Part, MD 10/08/20 8:21 AM [facility]

## 2020-10-08 NOTE — ED Notes (Signed)
The pt wanted to urinate but after getting on the bedpan she decided that she did not want to go

## 2020-10-08 NOTE — Progress Notes (Signed)
[facility] Progress Note Patient Name: Rebecca Trevino DOB: 1956/06/18 MRN: [id number]   Date of Service  10/08/2020  HPI/Events of Note  28F with h/o HTN who p/w HA, nausea, vomiting, blurry vision and peripheral vision loss. She was found to have a 2.9 cm pituitary mass (with effaced suprasellar cistern and mass effect on optic chiasm) and hyponatremia to 115. She has findings of bitemporal hemianopsia. She also was moderately hypokalemic at 3.1.  Random serum cortisol 1.4.   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 - Neurosurgery consult.  # Respiratory: - NAI.  # Cardiac: - NAI.  # Renal: - Hypokalemia: secondary to n/v; repletion with KCl runs. - Hyponatremia: Suspect hypovolemic hyponatremia +/- adrenal insufficiency. Will obtain UOsms and UNa to help confirm this. Treat with steroids + 3% saline at 20cc/hr initially. Check Na Q4H.  # Endo: - Probable adrenal insufficiency: Secondary to pituitary macroadenoma. Start dexamethasone IV.  # GI: - NPO for now. - Zofran PRN     Intervention Category Evaluation Type: New Patient Evaluation  [name] 10/08/2020, 2:04 AM

## 2020-10-08 NOTE — Progress Notes (Addendum)
NAME:  Rebecca Trevino, MRN:  [id number], DOB:  [birth year], LOS: 0 ADMISSION DATE:  10/07/2020, CONSULTATION DATE:  10/07/20  REFERRING MD:  Wyn Quaker, ED physician, CHIEF COMPLAINT:   Blurred vision x 2 weeks, headaches, nausea/vomiting  Brief History   64 yo woman with hx of HTN and GERD, here with  blurred vision x 2 weeks, headaches, nausea/vomiting, sent from [facility] due to bitemporal hemianopsia.  Found to have 2.9 cm round, intra-sellar mass with suprasellar extension.   History of present illness   Blurred vision x 2 weeks. Was thought to have sinus infection based on facial pain and pressure (no nasal congestion or rhinitis).  Amoxicillin was not helpful.   Seen at [facility] today for vision changes, exam notable for bitemporal hemianopsia so send to ED for MRI.    Was unable to complete mri, persistent leg and torso movement (Per notes has RLS and is unable to control movements), despite Ativan 0.5mg  IV x 2.    Na 115 initially --> 114.  K 3.2   Past Medical History  HTN  GERD  Significant Hospital Events     Consults:  Neurosurgery  Procedures:    Significant Diagnostic Tests:  MRI  1. Truncated exam demonstrates at 2.9 cm round, intra-sellar mass with suprasellar extension. Associated effaced suprasellar cistern and mass effect on the optic chiasm.  2. Favor Pituitary Macroadenoma at this point but recommend repeat MRI Head using Pituitary Protocol (without and with contrast) when the patient can tolerate for definitive imaging characterization and treatment planning.   Micro Data:    Antimicrobials:    Interim history/subjective:  No complaints this morning. Unchanged visual changes. Denies nausea and vomiting. Eating breakfast  Objective   Blood pressure 136/76, pulse 93, temperature 98.4 F (36.9 C), temperature source Axillary, resp. rate 15, height 5\' 6"  (1.676 m), weight 69.9 kg, SpO2 99 %.        Intake/Output Summary (Last 24  hours) at 10/08/2020 0821 Last data filed at 10/08/2020 0300 Gross per 24 hour  Intake 2.73 ml  Output --  Net 2.73 ml   Filed Weights   10/07/20 1539 10/08/20 0430  Weight: 70.3 kg 69.9 kg    Physical Exam: General: Well-appearing, no acute distress HENT: [place], AT, OP clear, MMM Eyes: EOMI, no scleral icterus  Respiratory: Clear to auscultation bilaterally.  No crackles, wheezing or rales Cardiovascular: RRR, -M/R/G, no JVD Extremities:-Edema,-tenderness Neuro: AAO x4, CNII-XII grossly intact, mild bitemporal hemianopsia Skin: Intact, no rashes or bruising Psych: Normal mood, normal affect  Resolved Hospital Problem list     Assessment & Plan:  Intrasellar mass: suspicious for pituitary macroadenoma per radiology.   Begin initial work up: serum prolactin, insulin-like growth factor-1 (IGF-1) , and plasma corticotropin (ACTH) and  urinary free cortisol ,LH, FSH, T4, TSH.  --Appreciate Neurosurgery consult --Plan for MRI Sella Protocol  Hyponatremia: suspect hypovolemic, adrenal insufficiency. SIADH considered however Una low. Low cortisol. ACTH, prolactin, FSH, LH, IGF pending. Normal TSH with low T4. Low/normal serum osm..  --S/p dexamethasone 5 mg once today --S/p 3% saline with improvement of Na 115>119 --Trend Na --Neurosurgery consult as above. Neurochecks per consult team --Consider outside endocrine consult when available. In interim:  Will start stress dose steroids x 24 hours, followed by 24-48 hours prednisone 10 mg, then prednisone 5 mg  Start levothyroxine 50 mcg daily Best practice (evaluated daily)   Diet: clears, Free water restriction Pain/Anxiety/Delirium protocol (if indicated):  VAP protocol (if indicated):  DVT prophylaxis: SCDs GI prophylaxis: pepcid Glucose control: Mobility:  last date of multidisciplinary goals of care discussion Family and staff present  Summary of discussion  Follow up goals of care discussion due Code Status: Full  Code Disposition:  Labs   CBC: Recent Labs  Lab 10/07/20 1605  WBC 5.4  HGB 13.7  HCT 36.6  MCV 81.2  PLT 579    Basic Metabolic Panel: Recent Labs  Lab 10/07/20 1605 10/07/20 1831 10/07/20 1945 10/08/20 0010  NA 115*  --  114* 115*  K 3.1*  --  3.2*  --   CL 77*  --  79*  --   CO2 23  --  21*  --   GLUCOSE 98  --  81  --   BUN 10  --  10  --   CREATININE 0.84  --  0.76  --   CALCIUM 9.4  --  9.3  --   MG  --  1.8  --   --    GFR: Estimated Creatinine Clearance: 64.8 mL/min (by C-G formula based on SCr of 0.76 mg/dL). Recent Labs  Lab 10/07/20 1605  WBC 5.4    Liver Function Tests: Recent Labs  Lab 10/07/20 1831  AST 51*  ALT 33  ALKPHOS 71  BILITOT 1.1  PROT 7.4  ALBUMIN 4.3   No results for input(s): LIPASE, AMYLASE in the last 168 hours. No results for input(s): AMMONIA in the last 168 hours.  ABG No results found for: PHART, PCO2ART, PO2ART, HCO3, TCO2, ACIDBASEDEF, O2SAT   Coagulation Profile: No results for input(s): INR, PROTIME in the last 168 hours.  Cardiac Enzymes: No results for input(s): CKTOTAL, CKMB, CKMBINDEX, TROPONINI in the last 168 hours.  HbA1C: No results found for: HGBA1C  CBG: Recent Labs  Lab 10/08/20 0159 10/08/20 0329 10/08/20 0740  GLUCAP 88 75 93   The patient is critically ill with multiple organ systems failure and requires high complexity decision making for assessment and support, frequent evaluation and titration of therapies, application of advanced monitoring technologies and extensive interpretation of multiple databases.  Independent Critical Care Time: 35 Minutes.   Rodman Pickle, M.D. [facility] Pulmonary/Critical Care Medicine 10/08/2020 8:22 AM   Please see Amion for pager number to reach on-call Pulmonary and Critical Care Team.

## 2020-10-08 NOTE — Progress Notes (Signed)
CRITICAL VALUE ALERT  Critical Value: 119 Na Date & Time Notied:  10/08/20 @ 1121  Provider Notified: Loanne Drilling, MD  Orders Received/Actions taken: Awaiting new orders.

## 2020-10-08 NOTE — ED Notes (Signed)
The pt has just started her 2nd run of potassium

## 2020-10-09 ENCOUNTER — Inpatient Hospital Stay (HOSPITAL_COMMUNITY): Payer: BC Managed Care – PPO

## 2020-10-09 LAB — SODIUM
Sodium: 124 mmol/L — ABNORMAL LOW (ref 135–145)
Sodium: 128 mmol/L — ABNORMAL LOW (ref 135–145)
Sodium: 130 mmol/L — ABNORMAL LOW (ref 135–145)
Sodium: 132 mmol/L — ABNORMAL LOW (ref 135–145)
Sodium: 130 mmol/L — ABNORMAL LOW (ref 135–145)

## 2020-10-09 LAB — BASIC METABOLIC PANEL
Anion gap: 10 (ref 5–15)
BUN: 5 mg/dL — ABNORMAL LOW (ref 8–23)
CO2: 22 mmol/L (ref 22–32)
Calcium: 9.4 mg/dL (ref 8.9–10.3)
Chloride: 95 mmol/L — ABNORMAL LOW (ref 98–111)
Creatinine, Ser: 0.7 mg/dL (ref 0.44–1.00)
GFR, Estimated: >60 mL/min (ref >60)
Glucose, Bld: 150 mg/dL — ABNORMAL HIGH (ref 70–99)
Potassium: 3.4 mmol/L — ABNORMAL LOW (ref 3.5–5.1)
Sodium: 127 mmol/L — ABNORMAL LOW (ref 135–145)

## 2020-10-09 LAB — CBC
HCT: 32.6 % — ABNORMAL LOW (ref 36.0–46.0)
Hemoglobin: 12.1 g/dL (ref 12.0–15.0)
MCH: 30 pg (ref 26.0–34.0)
MCHC: 37.1 g/dL — ABNORMAL HIGH (ref 30.0–36.0)
MCV: 80.7 fL (ref 80.0–100.0)
Platelets: 275 10*3/uL (ref 150–400)
RBC: 4.04 MIL/uL (ref 3.87–5.11)
RDW: 12.5 % (ref 11.5–15.5)
WBC: 12.1 10*3/uL — ABNORMAL HIGH (ref 4.0–10.5)
nRBC: 0 % (ref 0.0–0.2)

## 2020-10-09 LAB — FOLLICLE STIMULATING HORMONE: FSH: 7.7 m[IU]/mL

## 2020-10-09 LAB — PROLACTIN: Prolactin: 20.2 ng/mL (ref 4.8–23.3)

## 2020-10-09 LAB — LUTEINIZING HORMONE: LH: 0.9 m[IU]/mL

## 2020-10-09 LAB — INSULIN-LIKE GROWTH FACTOR: Somatomedin C: 42 ng/mL — ABNORMAL LOW (ref 52–196)

## 2020-10-09 MED ORDER — POTASSIUM CHLORIDE CRYS ER 20 MEQ PO TBCR
40.0000 meq | EXTENDED_RELEASE_TABLET | Freq: Once | ORAL | Status: AC
  Administered 2020-10-09: 40 meq via ORAL
  Filled 2020-10-09: qty 2

## 2020-10-09 NOTE — Progress Notes (Addendum)
NAME:  Rebecca Trevino, MRN:  [id number], DOB:  [birth year], LOS: 1 ADMISSION DATE:  10/07/2020, CONSULTATION DATE:  10/07/20  REFERRING MD:  Wyn Quaker, ED physician, CHIEF COMPLAINT:   Blurred vision x 2 weeks, headaches, nausea/vomiting  Brief History   64 yo woman with hx of HTN and GERD, here with  blurred vision x 2 weeks, headaches, nausea/vomiting, sent from [facility] due to bitemporal hemianopsia.  Found to have 2.9 cm round, intra-sellar mass with suprasellar extension.   History of present illness   Blurred vision x 2 weeks. Was thought to have sinus infection based on facial pain and pressure (no nasal congestion or rhinitis).  Amoxicillin was not helpful.   Seen at [facility] today for vision changes, exam notable for bitemporal hemianopsia so send to ED for MRI.    Was unable to complete mri, persistent leg and torso movement (Per notes has RLS and is unable to control movements), despite Ativan 0.5mg  IV x 2.    Na 115 initially --> 114.  K 3.2   Past Medical History  HTN  GERD  Significant Hospital Events     Consults:  Neurosurgery  Procedures:    Significant Diagnostic Tests:  MRI  1. Truncated exam demonstrates at 2.9 cm round, intra-sellar mass with suprasellar extension. Associated effaced suprasellar cistern and mass effect on the optic chiasm.  2. Favor Pituitary Macroadenoma at this point but recommend repeat MRI Head using Pituitary Protocol (without and with contrast) when the patient can tolerate for definitive imaging characterization and treatment planning.  MRI Sella Protocol 1. Up to 28 mm heterogeneously enhancing intrasellar mass with suprasellar extension most compatible with Pituitary Macroadenoma. There is susceptibility within the mass which might indicate prior hemorrhage, but homogeneous pre-contrast T1 signal argues against recent bleeding. Regional mass effect including splaying of surrounding vessels, compression of  the optic chiasm. No overt cavernous sinus involvement at this time.  2. No other acute intracranial abnormality. Moderately advanced signal changes in the cerebral white matter, and solitary chronic microhemorrhage in the cerebellum, most likely due to chronic small vessel disease.   Micro Data:    Antimicrobials:    Interim history/subjective:  No events overnight. Unchanged visual changes.  Objective   Blood pressure (!) 111/54, pulse 89, temperature 99.4 F (37.4 C), temperature source Oral, resp. rate 16, height 5\' 6"  (1.676 m), weight 69.6 kg, SpO2 100 %.        Intake/Output Summary (Last 24 hours) at 10/09/2020 1101 Last data filed at 10/08/2020 1300 Gross per 24 hour  Intake 469.92 ml  Output --  Net 469.92 ml   Filed Weights   10/07/20 1539 10/08/20 0430 10/09/20 0500  Weight: 70.3 kg 69.9 kg 69.6 kg   Physical Exam: General: Well-appearing, no acute distress HENT: [place], AT, OP clear, MMM Eyes: EOMI, no scleral icterus Respiratory: Clear to auscultation bilaterally.  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 Plan for resection tomorrow. Will return to ICU post-op for 24 hours --Neuro checks q4h  Hyponatremia: suspect hypovolemic, adrenal insufficiency. SIADH considered however Una low. Low cortisol. ACTH, prolactin, FSH, LH, IGF pending. Normal TSH with low T4. Low/normal serum osm..  --S/p dexamethasone 5 mg once on admission --S/p 3% saline --Trend Na q4h --Stress dose steroids  x 24 hours, followed by 24-48 hours prednisone 10 mg, then prednisone 5 mg --Start levothyroxine 50 mcg daily --Will need outpatient endocrine consult  Hypokalemia --Replete  Best  practice (evaluated daily)   Diet: clears, Free water restriction Pain/Anxiety/Delirium protocol (if indicated):  VAP protocol (if indicated):  DVT prophylaxis: SCDs GI prophylaxis: pepcid Glucose control: Mobility:  last date of multidisciplinary goals of care discussion Family and staff present  Summary of discussion  Follow up goals of care discussion due Code Status: Full Code Disposition: Transfer to [facility]  Labs   CBC: Recent Labs  Lab 10/07/20 1605 10/09/20 0436  WBC 5.4 12.1*  HGB 13.7 12.1  HCT 36.6 32.6*  MCV 81.2 80.7  PLT 268 664    Basic Metabolic Panel: Recent Labs  Lab 10/07/20 1605 10/07/20 1831 10/07/20 1945 10/08/20 0010 10/08/20 1629 10/08/20 2056 10/09/20 0028 10/09/20 0436 10/09/20 0812  NA 115*  --  114*   < > 123* 125* 124* 127* 128*  K 3.1*  --  3.2*  --   --   --   --  3.4*  --   CL 77*  --  79*  --   --   --   --  95*  --   CO2 23  --  21*  --   --   --   --  22  --   GLUCOSE 98  --  81  --   --   --   --  150*  --   BUN 10  --  10  --   --   --   --  5*  --   CREATININE 0.84  --  0.76  --   --   --   --  0.70  --   CALCIUM 9.4  --  9.3  --   --   --   --  9.4  --   MG  --  1.8  --   --   --   --   --   --   --    < > = values in this interval not displayed.   GFR: Estimated Creatinine Clearance: 64.8 mL/min (by C-G formula based on SCr of 0.7 mg/dL). Recent Labs  Lab 10/07/20 1605 10/09/20 0436  WBC 5.4 12.1*    Liver Function Tests: Recent Labs  Lab 10/07/20 1831  AST 51*  ALT 33  ALKPHOS 71  BILITOT 1.1  PROT 7.4  ALBUMIN 4.3   No results for input(s): LIPASE, AMYLASE in the last 168 hours. No results for input(s): AMMONIA in the last 168 hours.  ABG No results found for: PHART, PCO2ART, PO2ART, HCO3, TCO2, ACIDBASEDEF, O2SAT   Coagulation Profile: No results for input(s): INR, PROTIME in the last 168 hours.  Cardiac Enzymes: No results for input(s): CKTOTAL, CKMB, CKMBINDEX, TROPONINI in the last 168  hours.  HbA1C: No results found for: HGBA1C  CBG: Recent Labs  Lab 10/08/20 0159 10/08/20 0329 10/08/20 0740 10/08/20 1210  GLUCAP 88 75 93 168*   Care Time 35 min  Rodman Pickle, M.D. [facility] Pulmonary/Critical Care Medicine 10/09/2020 11:01 AM

## 2020-10-09 NOTE — Plan of Care (Signed)
  Problem: Clinical Measurements: Goal: Diagnostic test results will improve Outcome: Progressing   Problem: Activity: Goal: Risk for activity intolerance will decrease Outcome: Progressing   Problem: Nutrition: Goal: Adequate nutrition will be maintained Outcome: Progressing   Problem: Safety: Goal: Ability to remain free from injury will improve Outcome: Progressing   Problem: Skin Integrity: Goal: Risk for impaired skin integrity will decrease Outcome: Progressing

## 2020-10-09 NOTE — Progress Notes (Signed)
Neurosurgery Service Progress Note  Subjective: No acute events overnight, no new complaints   Objective: Vitals:   10/09/20 0500 10/09/20 0600 10/09/20 0748 10/09/20 0800  BP: 109/64 (!) 111/54    Pulse: 73 82  89  Resp: 15 13  16   Temp:   99.4 F (37.4 C)   TempSrc:   Oral   SpO2: 94% 94%  100%  Weight: 69.6 kg     Height:        Physical Exam: AOx3, PERRL, +bitemporal hemianopsia R > L, EOMI, FS, TM, Strength 5/5 x4, SILTx4, no drift  Assessment & Plan: 64 y.o. woman w/ pituitary apoplexy, hyponatremia, non-functioning tumor.  -OR tomorrow morning for transphenoidal resection -CT maxillofacial today for intra-op navigation -NPO p MN  Rebecca Trevino  10/09/20 11:30 AM

## 2020-10-10 ENCOUNTER — Inpatient Hospital Stay (HOSPITAL_COMMUNITY): Payer: BC Managed Care – PPO | Admitting: Anesthesiology

## 2020-10-10 ENCOUNTER — Encounter (HOSPITAL_COMMUNITY)
Admission: EM | Disposition: A | Discharge status: Home / Self Care | Payer: Self-pay | Source: Home / Self Care | Attending: Neurological Surgery

## 2020-10-10 ENCOUNTER — Encounter (HOSPITAL_COMMUNITY): Payer: Self-pay | Admitting: Neurological Surgery

## 2020-10-10 DIAGNOSIS — E236 Other disorders of pituitary gland: Secondary | ICD-10-CM | POA: Diagnosis present

## 2020-10-10 HISTORY — PX: TRANSPHENOIDAL APPROACH EXPOSURE: SHX6311

## 2020-10-10 LAB — RENAL FUNCTION PANEL
Albumin: 3.8 g/dL (ref 3.5–5.0)
Albumin: 3.5 g/dL (ref 3.5–5.0)
Anion gap: 10 (ref 5–15)
Anion gap: 11 (ref 5–15)
BUN: 5 mg/dL — ABNORMAL LOW (ref 8–23)
BUN: 7 mg/dL — ABNORMAL LOW (ref 8–23)
CO2: 21 mmol/L — ABNORMAL LOW (ref 22–32)
CO2: 20 mmol/L — ABNORMAL LOW (ref 22–32)
Calcium: 9 mg/dL (ref 8.9–10.3)
Calcium: 8.5 mg/dL — ABNORMAL LOW (ref 8.9–10.3)
Chloride: 101 mmol/L (ref 98–111)
Chloride: 96 mmol/L — ABNORMAL LOW (ref 98–111)
Creatinine, Ser: 0.7 mg/dL (ref 0.44–1.00)
Creatinine, Ser: 0.62 mg/dL (ref 0.44–1.00)
GFR, Estimated: >60 mL/min (ref >60)
GFR, Estimated: >60 mL/min (ref >60)
Glucose, Bld: 150 mg/dL — ABNORMAL HIGH (ref 70–99)
Glucose, Bld: 170 mg/dL — ABNORMAL HIGH (ref 70–99)
Phosphorus: 2.1 mg/dL — ABNORMAL LOW (ref 2.5–4.6)
Phosphorus: 2.8 mg/dL (ref 2.5–4.6)
Potassium: 4.2 mmol/L (ref 3.5–5.1)
Potassium: 4 mmol/L (ref 3.5–5.1)
Sodium: 132 mmol/L — ABNORMAL LOW (ref 135–145)
Sodium: 127 mmol/L — ABNORMAL LOW (ref 135–145)

## 2020-10-10 LAB — BASIC METABOLIC PANEL
Anion gap: 10 (ref 5–15)
BUN: 5 mg/dL — ABNORMAL LOW (ref 8–23)
CO2: 23 mmol/L (ref 22–32)
Calcium: 9.2 mg/dL (ref 8.9–10.3)
Chloride: 97 mmol/L — ABNORMAL LOW (ref 98–111)
Creatinine, Ser: 0.65 mg/dL (ref 0.44–1.00)
GFR, Estimated: >60 mL/min (ref >60)
Glucose, Bld: 104 mg/dL — ABNORMAL HIGH (ref 70–99)
Potassium: 3.9 mmol/L (ref 3.5–5.1)
Sodium: 130 mmol/L — ABNORMAL LOW (ref 135–145)

## 2020-10-10 LAB — CBC
HCT: 33.8 % — ABNORMAL LOW (ref 36.0–46.0)
Hemoglobin: 12.4 g/dL (ref 12.0–15.0)
MCH: 31.4 pg (ref 26.0–34.0)
MCHC: 36.7 g/dL — ABNORMAL HIGH (ref 30.0–36.0)
MCV: 85.6 fL (ref 80.0–100.0)
Platelets: 307 10*3/uL (ref 150–400)
RBC: 3.95 MIL/uL (ref 3.87–5.11)
RDW: 13.4 % (ref 11.5–15.5)
WBC: 12.4 10*3/uL — ABNORMAL HIGH (ref 4.0–10.5)
nRBC: 0 % (ref 0.0–0.2)

## 2020-10-10 LAB — ACTH: C206 ACTH: 4.6 pg/mL — ABNORMAL LOW (ref 7.2–63.3)

## 2020-10-10 SURGERY — ENDOSCOPY, PARANASAL SINUS, WITH SPHENOID SINUSOTOMY
Anesthesia: General | Site: Nose

## 2020-10-10 MED ORDER — PHENOL 1.4 % MT LIQD
1.0000 | OROMUCOSAL | Status: DC | PRN
  Administered 2020-10-10: 1 via OROMUCOSAL
  Filled 2020-10-10: qty 177

## 2020-10-10 MED ORDER — HYDROCODONE-ACETAMINOPHEN 5-325 MG PO TABS: 1.0000 | ORAL_TABLET | ORAL | Status: DC | PRN

## 2020-10-10 MED ORDER — ROCURONIUM BROMIDE 10 MG/ML (PF) SYRINGE
PREFILLED_SYRINGE | INTRAVENOUS | Status: AC
  Filled 2020-10-10: qty 10

## 2020-10-10 MED ORDER — LACTATED RINGERS IV SOLN: INTRAVENOUS | Status: DC | PRN

## 2020-10-10 MED ORDER — CEFAZOLIN SODIUM-DEXTROSE 2-4 GM/100ML-% IV SOLN
2.0000 g | Freq: Once | INTRAVENOUS | Status: AC
  Administered 2020-10-10: 2 g via INTRAVENOUS

## 2020-10-10 MED ORDER — HYDROMORPHONE HCL 1 MG/ML IJ SOLN
0.5000 mg | INTRAMUSCULAR | Status: DC | PRN
Start: 2020-10-10 — End: 2020-10-13
  Administered 2020-10-10: 0.5 mg via INTRAVENOUS
  Filled 2020-10-10: qty 1

## 2020-10-10 MED ORDER — ONDANSETRON HCL 4 MG/2ML IJ SOLN
INTRAMUSCULAR | Status: DC | PRN
  Administered 2020-10-10: 4 mg via INTRAVENOUS

## 2020-10-10 MED ORDER — PHENYLEPHRINE HCL-NACL 10-0.9 MG/250ML-% IV SOLN
INTRAVENOUS | Status: DC | PRN
  Administered 2020-10-10: 50 ug/min via INTRAVENOUS

## 2020-10-10 MED ORDER — LABETALOL HCL 5 MG/ML IV SOLN: 10.0000 mg | INTRAVENOUS | Status: DC | PRN

## 2020-10-10 MED ORDER — PHENYLEPHRINE 40 MCG/ML (10ML) SYRINGE FOR IV PUSH (FOR BLOOD PRESSURE SUPPORT)
PREFILLED_SYRINGE | INTRAVENOUS | Status: AC
  Filled 2020-10-10: qty 10

## 2020-10-10 MED ORDER — FENTANYL CITRATE (PF) 100 MCG/2ML IJ SOLN: 25.0000 ug | INTRAMUSCULAR | Status: DC | PRN

## 2020-10-10 MED ORDER — DOCUSATE SODIUM 100 MG PO CAPS
100.0000 mg | ORAL_CAPSULE | Freq: Two times a day (BID) | ORAL | Status: DC
  Administered 2020-10-10 – 2020-10-13 (×5): 100 mg via ORAL
  Filled 2020-10-10 (×6): qty 1

## 2020-10-10 MED ORDER — PHENYLEPHRINE HCL (PRESSORS) 10 MG/ML IV SOLN
INTRAVENOUS | Status: DC | PRN
  Administered 2020-10-10 (×2): 120 ug via INTRAVENOUS

## 2020-10-10 MED ORDER — MICROFIBRILLAR COLL HEMOSTAT EX PADS
MEDICATED_PAD | CUTANEOUS | Status: DC | PRN
  Administered 2020-10-10: 1 via TOPICAL

## 2020-10-10 MED ORDER — CHLORHEXIDINE GLUCONATE 0.12 % MT SOLN
OROMUCOSAL | Status: AC
  Administered 2020-10-10: 15 mL via OROMUCOSAL
  Filled 2020-10-10: qty 15

## 2020-10-10 MED ORDER — HEMOSTATIC AGENTS (NO CHARGE) OPTIME
TOPICAL | Status: DC | PRN
Start: 2020-10-10 — End: 2020-10-10
  Administered 2020-10-10: 1 via TOPICAL

## 2020-10-10 MED ORDER — ROCURONIUM 10MG/ML (10ML) SYRINGE FOR MEDFUSION PUMP - OPTIME
INTRAVENOUS | Status: DC | PRN
  Administered 2020-10-10: 100 mg via INTRAVENOUS

## 2020-10-10 MED ORDER — SODIUM CHLORIDE (PF) 0.9 % IJ SOLN
INTRAMUSCULAR | Status: AC
  Filled 2020-10-10: qty 10

## 2020-10-10 MED ORDER — GLYCOPYRROLATE PF 0.2 MG/ML IJ SOSY
PREFILLED_SYRINGE | INTRAMUSCULAR | Status: AC
  Filled 2020-10-10: qty 1

## 2020-10-10 MED ORDER — EPHEDRINE 5 MG/ML INJ
INTRAVENOUS | Status: AC
  Filled 2020-10-10: qty 10

## 2020-10-10 MED ORDER — DEXAMETHASONE SODIUM PHOSPHATE 10 MG/ML IJ SOLN
INTRAMUSCULAR | Status: DC | PRN
  Administered 2020-10-10: 10 mg via INTRAVENOUS

## 2020-10-10 MED ORDER — LIDOCAINE-EPINEPHRINE 1 %-1:100000 IJ SOLN
INTRAMUSCULAR | Status: AC
  Filled 2020-10-10: qty 1

## 2020-10-10 MED ORDER — LIDOCAINE-EPINEPHRINE 1 %-1:100000 IJ SOLN
INTRAMUSCULAR | Status: DC | PRN
  Administered 2020-10-10: 7 mL

## 2020-10-10 MED ORDER — CEFAZOLIN SODIUM-DEXTROSE 1-4 GM/50ML-% IV SOLN
1.0000 g | Freq: Three times a day (TID) | INTRAVENOUS | Status: AC
  Administered 2020-10-10 – 2020-10-11 (×3): 1 g via INTRAVENOUS
  Filled 2020-10-10 (×3): qty 50

## 2020-10-10 MED ORDER — SODIUM CHLORIDE 0.9 % IV SOLN: INTRAVENOUS | Status: DC | PRN

## 2020-10-10 MED ORDER — SUFENTANIL CITRATE 50 MCG/ML IV SOLN
INTRAVENOUS | Status: AC
  Filled 2020-10-10: qty 1

## 2020-10-10 MED ORDER — CEFAZOLIN SODIUM-DEXTROSE 2-4 GM/100ML-% IV SOLN
INTRAVENOUS | Status: AC
  Filled 2020-10-10: qty 100

## 2020-10-10 MED ORDER — HEPARIN SODIUM (PORCINE) 5000 UNIT/ML IJ SOLN
5000.0000 [IU] | Freq: Three times a day (TID) | INTRAMUSCULAR | Status: DC
  Administered 2020-10-12 – 2020-10-13 (×4): 5000 [IU] via SUBCUTANEOUS
  Filled 2020-10-10 (×4): qty 1

## 2020-10-10 MED ORDER — LIDOCAINE HCL (CARDIAC) PF 100 MG/5ML IV SOSY
PREFILLED_SYRINGE | INTRAVENOUS | Status: DC | PRN
  Administered 2020-10-10: 80 mg via INTRAVENOUS

## 2020-10-10 MED ORDER — ACETAMINOPHEN 650 MG RE SUPP: 650.0000 mg | RECTAL | Status: DC | PRN

## 2020-10-10 MED ORDER — PROPOFOL 10 MG/ML IV BOLUS
INTRAVENOUS | Status: AC
  Filled 2020-10-10: qty 20

## 2020-10-10 MED ORDER — TRIAMCINOLONE ACETONIDE 40 MG/ML IJ SUSP
INTRAMUSCULAR | Status: AC
  Filled 2020-10-10: qty 5

## 2020-10-10 MED ORDER — DEXAMETHASONE SODIUM PHOSPHATE 10 MG/ML IJ SOLN
INTRAMUSCULAR | Status: AC
  Filled 2020-10-10: qty 1

## 2020-10-10 MED ORDER — CHLORHEXIDINE GLUCONATE 0.12 % MT SOLN: 15.0000 mL | Freq: Once | OROMUCOSAL | Status: AC

## 2020-10-10 MED ORDER — LIDOCAINE HCL (PF) 2 % IJ SOLN
INTRAMUSCULAR | Status: AC
  Filled 2020-10-10: qty 5

## 2020-10-10 MED ORDER — SUGAMMADEX SODIUM 200 MG/2ML IV SOLN
INTRAVENOUS | Status: DC | PRN
  Administered 2020-10-10: 200 mg via INTRAVENOUS

## 2020-10-10 MED ORDER — SODIUM CHLORIDE 0.9 % IR SOLN
Status: DC | PRN
  Administered 2020-10-10 (×2): 1000 mL

## 2020-10-10 MED ORDER — GLYCOPYRROLATE 0.2 MG/ML IJ SOLN
INTRAMUSCULAR | Status: DC | PRN
  Administered 2020-10-10: .2 mg via INTRAVENOUS

## 2020-10-10 MED ORDER — OXYMETAZOLINE HCL 0.05 % NA SOLN
NASAL | Status: DC | PRN
  Administered 2020-10-10: 1 via TOPICAL

## 2020-10-10 MED ORDER — 0.9 % SODIUM CHLORIDE (POUR BTL) OPTIME
TOPICAL | Status: DC | PRN
  Administered 2020-10-10: 1000 mL

## 2020-10-10 MED ORDER — MIDAZOLAM HCL 2 MG/2ML IJ SOLN
INTRAMUSCULAR | Status: AC
  Filled 2020-10-10: qty 2

## 2020-10-10 MED ORDER — SALINE SPRAY 0.65 % NA SOLN
4.0000 | NASAL | Status: DC | PRN
  Administered 2020-10-12: 4 via NASAL
  Filled 2020-10-10: qty 44

## 2020-10-10 MED ORDER — MUPIROCIN 2 % EX OINT
TOPICAL_OINTMENT | CUTANEOUS | Status: AC
  Filled 2020-10-10: qty 22

## 2020-10-10 MED ORDER — CEPHALEXIN 500 MG PO CAPS
500.0000 mg | ORAL_CAPSULE | Freq: Three times a day (TID) | ORAL | Status: DC
  Administered 2020-10-11 – 2020-10-13 (×6): 500 mg via ORAL
  Filled 2020-10-10 (×8): qty 1

## 2020-10-10 MED ORDER — ACETAMINOPHEN 500 MG PO TABS
1000.0000 mg | ORAL_TABLET | Freq: Once | ORAL | Status: AC
  Administered 2020-10-10: 1000 mg via ORAL
  Filled 2020-10-10: qty 2

## 2020-10-10 MED ORDER — ACETAMINOPHEN 325 MG PO TABS
650.0000 mg | ORAL_TABLET | ORAL | Status: DC | PRN
  Administered 2020-10-10 – 2020-10-11 (×4): 650 mg via ORAL
  Filled 2020-10-10 (×4): qty 2

## 2020-10-10 MED ORDER — SUFENTANIL CITRATE 50 MCG/ML IV SOLN
INTRAVENOUS | Status: DC | PRN
  Administered 2020-10-10 (×3): 10 ug via INTRAVENOUS

## 2020-10-10 MED ORDER — OXYMETAZOLINE HCL 0.05 % NA SOLN
NASAL | Status: AC
  Filled 2020-10-10: qty 30

## 2020-10-10 MED ORDER — ONDANSETRON HCL 4 MG/2ML IJ SOLN
INTRAMUSCULAR | Status: AC
  Filled 2020-10-10: qty 2

## 2020-10-10 MED ORDER — EPHEDRINE SULFATE 50 MG/ML IJ SOLN
INTRAMUSCULAR | Status: DC | PRN
Start: 2020-10-10 — End: 2020-10-10
  Administered 2020-10-10: 10 mg via INTRAVENOUS

## 2020-10-10 MED ORDER — PROPOFOL 10 MG/ML IV BOLUS
INTRAVENOUS | Status: DC | PRN
  Administered 2020-10-10: 140 mg via INTRAVENOUS

## 2020-10-10 MED ORDER — CEPHALEXIN 500 MG PO CAPS: 500.0000 mg | ORAL_CAPSULE | Freq: Three times a day (TID) | ORAL | 0 refills | Status: AC

## 2020-10-10 MED ORDER — ONDANSETRON HCL 4 MG/2ML IJ SOLN: 4.0000 mg | Freq: Once | INTRAMUSCULAR | Status: DC | PRN

## 2020-10-10 SURGICAL SUPPLY — 101 items
ATTRACTOMAT 16X20 MAGNETIC DRP (DRAPES) ×3 IMPLANT
BAND RUBBER #18 3X1/16 STRL (MISCELLANEOUS) ×6 IMPLANT
BENZOIN TINCTURE PRP APPL 2/3 (GAUZE/BANDAGES/DRESSINGS) ×3 IMPLANT
BLADE ROTATE TRICUT 4MX13CM M4 (BLADE) ×1
BLADE ROTATE TRICUT 4X13 M4 (BLADE) ×2 IMPLANT
BLADE SURG 10 STRL SS (BLADE) ×3 IMPLANT
BLADE SURG 11 STRL SS (BLADE) ×6 IMPLANT
BLADE SURG 15 STRL LF DISP TIS (BLADE) ×2 IMPLANT
BLADE SURG 15 STRL SS (BLADE) ×4
BUR TAPER CHOANAL ATRESIA 30K (BURR) ×3 IMPLANT
CABLE BIPOLOR RESECTION CORD (MISCELLANEOUS) ×3 IMPLANT
CANISTER SUCT 3000ML PPV (MISCELLANEOUS) ×9 IMPLANT
CLOSURE WOUND 1/2 X4 (GAUZE/BANDAGES/DRESSINGS) ×1
CNTNR URN SCR LID CUP LEK RST (MISCELLANEOUS) ×1 IMPLANT
COAGULATOR SUCT 8FR VV (MISCELLANEOUS) IMPLANT
CONT SPEC 4OZ STRL OR WHT (MISCELLANEOUS) ×2
COVER BACK TABLE 60X90IN (DRAPES) IMPLANT
COVER MAYO STAND STRL (DRAPES) ×3 IMPLANT
COVER WAND RF STERILE (DRAPES) ×3 IMPLANT
DRAPE HALF SHEET 40X57 (DRAPES) ×6 IMPLANT
DRAPE INCISE IOBAN 66X45 STRL (DRAPES) ×3 IMPLANT
DRAPE MICROSCOPE LEICA (MISCELLANEOUS) IMPLANT
DRAPE ORTHO SPLIT 77X108 STRL (DRAPES) ×2
DRAPE SURG 17X23 STRL (DRAPES) ×9 IMPLANT
DRAPE SURG ORHT 6 SPLT 77X108 (DRAPES) ×1 IMPLANT
DRESSING NASAL POPE 10X1.5X2.5 (GAUZE/BANDAGES/DRESSINGS) IMPLANT
DRSG NASAL POPE 10X1.5X2.5 (GAUZE/BANDAGES/DRESSINGS)
DRSG NASOPORE 8CM (GAUZE/BANDAGES/DRESSINGS) ×3 IMPLANT
DURAPREP 26ML APPLICATOR (WOUND CARE) ×3 IMPLANT
ELECT NEEDLE TIP 2.8 STRL (NEEDLE) ×3 IMPLANT
ELECT REM PT RETURN 9FT ADLT (ELECTROSURGICAL) ×6
ELECTRODE REM PT RTRN 9FT ADLT (ELECTROSURGICAL) ×2 IMPLANT
GAUZE PACKING FOLDED 2  STR (GAUZE/BANDAGES/DRESSINGS) ×2
GAUZE PACKING FOLDED 2 STR (GAUZE/BANDAGES/DRESSINGS) ×1 IMPLANT
GAUZE SPONGE 2X2 8PLY STRL LF (GAUZE/BANDAGES/DRESSINGS) ×1 IMPLANT
GAUZE SPONGE 4X4 12PLY STRL (GAUZE/BANDAGES/DRESSINGS) ×3 IMPLANT
GLOVE BIO SURGEON STRL SZ 6.5 (GLOVE) ×2 IMPLANT
GLOVE BIO SURGEON STRL SZ7.5 (GLOVE) ×3 IMPLANT
GLOVE BIO SURGEONS STRL SZ 6.5 (GLOVE) ×1
GLOVE BIOGEL M 7.0 STRL (GLOVE) ×6 IMPLANT
GLOVE BIOGEL PI IND STRL 6.5 (GLOVE) ×1 IMPLANT
GLOVE BIOGEL PI IND STRL 7.5 (GLOVE) ×1 IMPLANT
GLOVE BIOGEL PI INDICATOR 6.5 (GLOVE) ×2
GLOVE BIOGEL PI INDICATOR 7.5 (GLOVE) ×2
GLOVE EXAM NITRILE LRG STRL (GLOVE) IMPLANT
GLOVE EXAM NITRILE XL STR (GLOVE) IMPLANT
GLOVE EXAM NITRILE XS STR PU (GLOVE) IMPLANT
GOWN STRL REUS W/ TWL LRG LVL3 (GOWN DISPOSABLE) ×2 IMPLANT
GOWN STRL REUS W/ TWL XL LVL3 (GOWN DISPOSABLE) IMPLANT
GOWN STRL REUS W/TWL 2XL LVL3 (GOWN DISPOSABLE) ×3 IMPLANT
GOWN STRL REUS W/TWL LRG LVL3 (GOWN DISPOSABLE) ×4
GOWN STRL REUS W/TWL XL LVL3 (GOWN DISPOSABLE)
HEMOSTAT POWDER KIT SURGIFOAM (HEMOSTASIS) ×3 IMPLANT
HEMOSTAT SURGICEL 2X14 (HEMOSTASIS) IMPLANT
KIT BASIN OR (CUSTOM PROCEDURE TRAY) ×6 IMPLANT
KIT DRAIN CSF ACCUDRAIN (MISCELLANEOUS) IMPLANT
KIT TURNOVER KIT B (KITS) ×6 IMPLANT
KNIFE ARACHNOID DISP AM-21-S (BLADE) IMPLANT
MARKER SKIN DUAL TIP RULER LAB (MISCELLANEOUS) ×3 IMPLANT
NEEDLE HYPO 25GX1X1/2 BEV (NEEDLE) ×3 IMPLANT
NEEDLE HYPO 25X1 1.5 SAFETY (NEEDLE) ×3 IMPLANT
NEEDLE SPNL 22GX3.5 QUINCKE BK (NEEDLE) ×3 IMPLANT
NEEDLE SPNL 25GX3.5 QUINCKE BL (NEEDLE) ×3 IMPLANT
NS IRRIG 1000ML POUR BTL (IV SOLUTION) ×6 IMPLANT
PAD ARMBOARD 7.5X6 YLW CONV (MISCELLANEOUS) ×9 IMPLANT
PATTIES SURGICAL .25X.25 (GAUZE/BANDAGES/DRESSINGS) IMPLANT
PATTIES SURGICAL .5 X.5 (GAUZE/BANDAGES/DRESSINGS) ×3 IMPLANT
PATTIES SURGICAL .5 X3 (DISPOSABLE) ×3 IMPLANT
PENCIL BUTTON HOLSTER BLD 10FT (ELECTRODE) ×3 IMPLANT
PROBE FOR NEUROSURGERY (MISCELLANEOUS) IMPLANT
SEALANT ADHERUS EXTEND TIP (MISCELLANEOUS) ×3 IMPLANT
SHEATH ENDOSCRUB 0 DEG (SHEATH) ×3 IMPLANT
SPONGE GAUZE 2X2 STER 10/PKG (GAUZE/BANDAGES/DRESSINGS) ×2
SPONGE LAP 4X18 RFD (DISPOSABLE) ×3 IMPLANT
SPONGE NEURO XRAY DETECT 1X3 (DISPOSABLE) ×3 IMPLANT
SPONGE SURGIFOAM ABS GEL SZ50 (HEMOSTASIS) IMPLANT
STAPLER SKIN PROX WIDE 3.9 (STAPLE) ×3 IMPLANT
STRIP CLOSURE SKIN 1/2X4 (GAUZE/BANDAGES/DRESSINGS) ×2 IMPLANT
SUT 5.0 PDS RB-1 (SUTURE)
SUT BONE WAX W31G (SUTURE) ×3 IMPLANT
SUT ETHILON 3 0 FSL (SUTURE) IMPLANT
SUT ETHILON 3 0 PS 1 (SUTURE) IMPLANT
SUT ETHILON 6 0 P 1 (SUTURE) IMPLANT
SUT PDS AB 4-0 RB1 27 (SUTURE) IMPLANT
SUT PDS PLUS AB 5-0 RB-1 (SUTURE) IMPLANT
SUT PLAIN 4 0 ~~LOC~~ 1 (SUTURE) IMPLANT
SUT VIC AB 4-0 P-3 18X BRD (SUTURE) IMPLANT
SUT VIC AB 4-0 P3 18 (SUTURE)
SYR CONTROL 10ML LL (SYRINGE) ×3 IMPLANT
TOWEL GREEN STERILE (TOWEL DISPOSABLE) ×3 IMPLANT
TOWEL GREEN STERILE FF (TOWEL DISPOSABLE) ×6 IMPLANT
TRACKER ENT INSTRUMENT (MISCELLANEOUS) ×3 IMPLANT
TRACKER ENT PATIENT (MISCELLANEOUS) ×3 IMPLANT
TRAP SPECIMEN MUCUS 40CC (MISCELLANEOUS) IMPLANT
TRAY ENT MC OR (CUSTOM PROCEDURE TRAY) ×6 IMPLANT
TRAY FOLEY MTR SLVR 16FR STAT (SET/KITS/TRAYS/PACK) ×3 IMPLANT
TUBE CONNECTING 12'X1/4 (SUCTIONS) ×1
TUBE CONNECTING 12X1/4 (SUCTIONS) ×2 IMPLANT
TUBING EXTENTION W/L.L. (IV SETS) ×3 IMPLANT
TUBING STRAIGHTSHOT EPS 5PK (TUBING) ×3 IMPLANT
WATER STERILE IRR 1000ML POUR (IV SOLUTION) ×3 IMPLANT

## 2020-10-10 NOTE — Progress Notes (Signed)
   NAME:  Rebecca Trevino, MRN:  [id number], DOB:  [birth year], LOS: 2 ADMISSION DATE:  10/07/2020, CONSULTATION DATE:  10/07/20  REFERRING MD:  Wyn Quaker, ED physician, CHIEF COMPLAINT:   Blurred vision x 2 weeks, headaches, nausea/vomiting  Brief History   64 yo woman with hx of HTN and GERD, here with  blurred vision x 2 weeks, headaches, nausea/vomiting, sent from [facility] due to bitemporal hemianopsia.  Found to have 2.9 cm round, intra-sellar mass with suprasellar extension.    Past Medical History  HTN  GERD  Significant Hospital Events     Consults:  Neurosurgery  Procedures:    Significant Diagnostic Tests:  MRI  1. Truncated exam demonstrates at 2.9 cm round, intra-sellar mass with suprasellar extension. Associated effaced suprasellar cistern and mass effect on the optic chiasm.  2. Favor Pituitary Macroadenoma at this point but recommend repeat MRI Head using Pituitary Protocol (without and with contrast) when the patient can tolerate for definitive imaging characterization and treatment planning.  MRI Sella Protocol 1. Up to 28 mm heterogeneously enhancing intrasellar mass with suprasellar extension most compatible with Pituitary Macroadenoma. There is susceptibility within the mass which might indicate prior hemorrhage, but homogeneous pre-contrast T1 signal argues against recent bleeding. Regional mass effect including splaying of surrounding vessels, compression of the optic chiasm. No overt cavernous sinus involvement at this time.  2. No other acute intracranial abnormality. Moderately advanced signal changes in the cerebral white matter, and solitary chronic microhemorrhage in the cerebellum, most likely due to chronic small vessel disease.   Micro Data:    Antimicrobials:    Interim history/subjective:  Awaiting surg . No complaints  Objective   Blood pressure 107/78, pulse 85, temperature 97.7 F (36.5 C), temperature source  Oral, resp. rate 14, height 5\' 6"  (1.676 m), weight 69.6 kg, SpO2 100 %.       No intake or output data in the 24 hours ending 10/10/20 0909 Filed Weights   10/07/20 1539 10/08/20 0430 10/09/20 0500  Weight: 70.3 kg 69.9 kg 69.6 kg   Physical Exam: General this is a very pleasant 64 year old female currently sitting up in chair HENT NCAT no JVD PERRL Pulm clear no accessory use  Card RRR abd soft not tender.  Ext warm and dry  Neuro awake and alert. 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  Plan  For transphenoidal resxn today    Hyponatremia: suspect hypovolemic, adrenal insufficiency. SIADH considered however Una low. Low cortisol. ACTH, prolactin, FSH, LH, IGF pending. Normal TSH with low T4. Low/normal serum osm..  S/p dexamethasone 5 mg once on admission S/p 3% saline Plan Cont to trend Na q 4 Will need to watch for hypernatremia s/p resxn  Cont stress dose steroids, now on pred taper  Cont synthroid   Best practice (evaluated daily)   Diet: clears, Free water restriction Pain/Anxiety/Delirium protocol (if indicated):  VAP protocol (if indicated):  DVT prophylaxis: SCDs GI prophylaxis: pepcid Glucose control: Mobility:  last date of multidisciplinary goals of care discussion Family and staff present  Summary of discussion  Follow up goals of care discussion due Code Status: Full Code Disposition: will go to 4N post-op   Erick Colace ACNP-BC [place] Pager # [phone] OR # [phone] if no answer

## 2020-10-10 NOTE — Consult Note (Signed)
ENT CONSULT:  Reason for Consult: Pituitary tumor Referring Physician: Dr. Virgina Norfolk is an 64 y.o. female.  HPI: Patient admitted to [facility] on 10/08/2020 for evaluation and treatment of acute nausea and vomiting with bitemporal hemianopsia.  Patient reported 2-week history of progressive symptoms of headache and nausea and vomiting.  She was evaluated by her ophthalmologist and found to have a visual defect.  Referred to the hospital for additional evaluation and scanning.  MRI scan showed a large pituitary mass extending superiorly consistent with possible hemorrhage within a pituitary adenoma.  Past Medical History:  Diagnosis Date  . High blood pressure     History reviewed. No pertinent surgical history.  History reviewed. No pertinent family history.  Social History:  has no history on file for tobacco use, alcohol use, and drug use.  Allergies:  Allergies  Allergen Reactions  . Codeine Nausea And Vomiting  . Erythromycin Nausea And Vomiting    Medications: I have reviewed the patient's current medications.  Results for orders placed or performed during the hospital encounter of 10/07/20 (from the past 48 hour(s))  Glucose, capillary     Status: Abnormal   Collection Time: 10/08/20 12:10 PM  Result Value Ref Range   Glucose-Capillary 168 (H) 70 - 99 mg/dL    Comment: Glucose reference range applies only to samples taken after fasting for at least 8 hours.  Sodium     Status: Abnormal   Collection Time: 10/08/20  2:11 PM  Result Value Ref Range   Sodium 120 (L) 135 - 145 mmol/L    Comment: Performed at [facility], [place] [address], [place], [place] [address]  Sodium     Status: Abnormal   Collection Time: 10/08/20  4:29 PM  Result Value Ref Range   Sodium 123 (L) 135 - 145 mmol/L    Comment: Performed at [facility], [place] [address], [place], [place] [address]  Sodium     Status: Abnormal   Collection Time: 10/08/20  8:56 PM  Result  Value Ref Range   Sodium 125 (L) 135 - 145 mmol/L    Comment: Performed at [facility] [address] [place], [place] [address]  Sodium     Status: Abnormal   Collection Time: 10/09/20 12:28 AM  Result Value Ref Range   Sodium 124 (L) 135 - 145 mmol/L    Comment: Performed at [facility] [address], [place], [place] [address]  Basic metabolic panel     Status: Abnormal   Collection Time: 10/09/20  4:36 AM  Result Value Ref Range   Sodium 127 (L) 135 - 145 mmol/L   Potassium 3.4 (L) 3.5 - 5.1 mmol/L   Chloride 95 (L) 98 - 111 mmol/L   CO2 22 22 - 32 mmol/L   Glucose, Bld 150 (H) 70 - 99 mg/dL    Comment: Glucose reference range applies only to samples taken after fasting for at least 8 hours.   BUN 5 (L) 8 - 23 mg/dL   Creatinine, Ser 0.70 0.44 - 1.00 mg/dL   Calcium 9.4 8.9 - 10.3 mg/dL   GFR, Estimated >60 >60 mL/min    Comment: (NOTE) Calculated using the CKD-EPI Creatinine Equation (2021)    Anion gap 10 5 - 15    Comment: Performed at [facility] [address] [place],  [address]  CBC     Status: Abnormal   Collection Time: 10/09/20  4:36 AM  Result Value Ref Range   WBC 12.1 (H) 4.0 - 10.5 K/uL   RBC 4.04 3.87 - 5.11 MIL/uL   Hemoglobin 12.1 12.0 - 15.0 g/dL   HCT 32.6 (L) 36.0 - 46.0 %   MCV 80.7 80.0 - 100.0 fL   MCH 30.0 26.0 - 34.0 pg   MCHC 37.1 (H) 30.0 - 36.0 g/dL   RDW 12.5 11.5 - 15.5 %   Platelets 275 150 - 400 K/uL   nRBC 0.0 0.0 - 0.2 %    Comment: Performed at [facility] [address], [place], [place] [address]  Sodium     Status: Abnormal   Collection Time: 10/09/20  8:12 AM  Result Value Ref Range   Sodium 128 (L) 135 - 145 mmol/L    Comment: Performed at [facility], [place] [address] [place], [place] [address]  Sodium     Status: Abnormal   Collection Time: 10/09/20 12:00 PM  Result Value Ref Range   Sodium 130 (L) 135 - 145 mmol/L    Comment: Performed at [facility], [place] [address], [place], [place] [address]  Sodium     Status: Abnormal   Collection Time: 10/09/20  4:32 PM  Result Value Ref Range   Sodium 132 (L) 135 - 145 mmol/L    Comment: Performed at [facility] [address], [place], [place] [address]  Sodium     Status: Abnormal   Collection Time: 10/09/20  8:24 PM  Result Value Ref Range   Sodium 130 (L) 135 - 145 mmol/L    Comment: Performed at [facility], [place] [address], [place], [place] [address]  Basic metabolic panel     Status: Abnormal   Collection Time: 10/10/20  2:10 AM  Result Value Ref Range   Sodium 130 (L) 135 - 145 mmol/L   Potassium 3.9 3.5 - 5.1 mmol/L   Chloride 97 (L) 98 - 111 mmol/L   CO2 23 22 - 32 mmol/L   Glucose, Bld 104 (H) 70 - 99 mg/dL    Comment: Glucose reference range applies only to samples taken after fasting for at least 8 hours.   BUN 5 (L) 8 - 23 mg/dL   Creatinine, Ser 0.65 0.44 - 1.00 mg/dL   Calcium 9.2 8.9 - 10.3 mg/dL   GFR, Estimated >60 >60 mL/min    Comment: (NOTE) Calculated using the CKD-EPI Creatinine Equation (2021)    Anion gap 10 5 - 15    Comment: Performed at [facility] [address] [place], [place] [address]    MR BRAIN W WO CONTRAST  Result Date: 10/08/2020 CLINICAL DATA:  64 year old female with bitemporal hemianopsia, headache and vomiting, intra sellar/suprasellar mass on truncated noncontrast MRI yesterday. EXAM: MRI HEAD WITHOUT AND WITH CONTRAST TECHNIQUE: Multiplanar, multiecho pulse sequences of the brain and surrounding structures were obtained without and with intravenous contrast. CONTRAST:  1mL GADAVIST GADOBUTROL 1 MMOL/ML IV SOLN COMPARISON:  10/07/2020. FINDINGS: Brain: Pituitary/suprasellar abnormality detailed below. No superimposed restricted diffusion to suggest acute infarction, midline shift, ventriculomegaly, or acute intracranial hemorrhage. Cervicomedullary junction within normal limits. Patchy, scattered bilateral white matter T2 and FLAIR  hyperintensity is stable from yesterday. No cortical encephalomalacia. However, there is possibly a small chronic microhemorrhage in the left cerebellum on series 7, image 27. Outside of the pituitary abnormality no abnormal gray or white matter enhancement identified. Vascular: Major intracranial vascular flow voids are  preserved. Mild splaying of the cavernous ICA segments related to the sellar lesion described below. The major dural venous sinuses are enhancing and appear to be patent. Skull and upper cervical spine: Negative. Skull base bone marrow signal remains normal. Sinuses/Orbits: Intraorbital soft tissues, paranasal sinuses and mastoids appear stable and negative. Other: Dedicated thin slice pre and postcontrast pituitary imaging. No normal pituitary gland or infundibulum identified. Heterogeneously enhancing soft tissue mass expanding the sella turcica, mildly splaying both ICA siphons, and extending cephalad effacing the suprasellar cistern and undersurface of the brain. The mass encompasses 23 by 25 by 28 mm (AP by transverse by CC). There is generalized susceptibility within the mass as seen on series 7, image 45. But pre contrast T1 signal is homogeneous. Mild T2 signal heterogeneity. No skull base invasion. Extension toward the left cavernous sinus, but no overt cavernous sinus infiltration on either side. Effaced optic chiasm which cannot be identified separate from the mass. No regional cerebral edema. IMPRESSION: 1. Up to 28 mm heterogeneously enhancing intrasellar mass with suprasellar extension most compatible with Pituitary Macroadenoma. There is susceptibility within the mass which might indicate prior hemorrhage, but homogeneous pre-contrast T1 signal argues against recent bleeding. Regional mass effect including splaying of surrounding vessels, compression of the optic chiasm. No overt cavernous sinus involvement at this time. 2. No other acute intracranial abnormality. Moderately advanced  signal changes in the cerebral white matter, and solitary chronic microhemorrhage in the cerebellum, most likely due to chronic small vessel disease. Electronically Signed   By: Genevie Ann M.D.   On: 10/08/2020 18:36   DG Chest Port 1 View  Result Date: 10/09/2020 CLINICAL DATA:  Hyponatremia EXAM: PORTABLE CHEST 1 VIEW COMPARISON:  None. FINDINGS: Heart and mediastinal contours are within normal limits. No focal opacities or effusions. No acute bony abnormality. IMPRESSION: No active disease. Electronically Signed   By: Rolm Baptise M.D.   On: 10/09/2020 03:52   CT MAXILLOFACIAL WO CONTRAST  Result Date: 10/09/2020 CLINICAL DATA:  64 year old female with suspected pituitary adenoma. Preoperative surgical planning. EXAM: CT MAXILLOFACIAL WITHOUT CONTRAST TECHNIQUE: Multidetector CT imaging of the maxillofacial structures was performed. Multiplanar CT image reconstructions were also generated. COMPARISON:  Pituitary protocol brain MRI 10/08/2020 and earlier. FINDINGS: Osseous: Some bony expansion of the sella turcica again noted. Floor of the sella appears thinned but intact. Central skull base intact. Absent maxillary dentition.  Facial bones appear intact. Partially visible cervical spine degeneration. Orbits: Intact orbital walls. Symmetric and negative orbits soft tissues. Sinuses: Bilateral paranasal sinuses are clear. Sphenoid sinuses are hyperplastic (coronal image 53) with pneumatized right anterior clinoid process. Uncovering of the vidian canals. No Onodi cell. Nasal septum intact with mild leftward septal deviation. Paradoxical rotation of the anterior bilateral middle turbinates. Tympanic cavities and mastoids are clear. Soft tissues: Mild motion artifact at the larynx. Negative visible noncontrast deep soft tissue spaces of the face. Limited intracranial: Stable from the MRI yesterday. Intra sellar mass with suprasellar extension hyperdense to background brain parenchyma redemonstrated on series  5, image 60. No ventriculomegaly. IMPRESSION: Preoperative for stereotactic surgical planning. Floor of the sella appears thinned but intact. Hyperplastic sphenoid sinuses. Mild leftward nasal septal deviation. Paradoxical rotation of the anterior middle turbinates. Presumed pituitary adenoma with suprasellar extension stable from the MRI yesterday. Electronically Signed   By: Genevie Ann M.D.   On: 10/09/2020 13:24    ROS:ROS 12 systems reviewed and negative except as stated in HPI   Blood pressure (!) 99/59, pulse 71, temperature  97.7 F (36.5 C), temperature source Oral, resp. rate 14, height 5\' 6"  (1.676 m), weight 69.6 kg, SpO2 96 %.  PHYSICAL EXAM: General appearance - alert, well appearing, and in no distress Mental status - alert, oriented to person, place, and time Eyes - pupils equal and reactive, extraocular eye movements intact, no evidence of restriction or diplopia Nose - normal and patent, no erythema, discharge or polyps and midline nasal septum, no evidence of mucosal mass or discharge Mouth - mucous membranes moist, pharynx normal without lesions Neck - supple, no significant adenopathy  Studies Reviewed: Maxillofacial CT scan of the sinuses with Fusion format shows normal-appearing sinonasal anatomy.  Midline nasal septum and patent anterior nasal passageway, minimal turbinate hypertrophy.  No evidence of sinusitis, mucosal disease or significant anatomic abnormality.  The patient has a patent sphenoid sinus ostium bilaterally.  No significant anterior bone erosion of the sella.  MRI scan brain shows soft tissue mass extending from the sella superiorly abutting the optic nerves.  There appears to be hemorrhagic component within the tumor mass.  No evidence of lateral extension.  Assessment/Plan: Patient admitted to [facility] for management of acute nausea and vomiting, headache and finding of bilateral hemianopsia.  MRI scan showed pituitary mass extending superiorly  consistent with her clinical diagnosis of visual change and headache.  Patient scheduled to undergo endoscopic transsphenoidal pituitary resection with navigation in conjunction with Dr. Zada Finders from the neurosurgical service.  Patient evaluated and indications for surgery reviewed.  Risks and benefits of this procedure were discussed in detail with the patient understood and agreed with our plan for surgery which is scheduled on an urgent basis at [facility].  Jerrell Belfast 10/10/2020, 10:41 AM

## 2020-10-10 NOTE — Transfer of Care (Signed)
Immediate Anesthesia Transfer of Care Note  Patient: Rebecca Trevino  Procedure(s) Performed: ENDONASAL ENDOSCOPIC TRANSPHENOIDAL TUMOR RESECTION (N/A Nose)  Patient Location: PACU  Anesthesia Type:General  Level of Consciousness: oriented, drowsy, patient cooperative and responds to stimulation  Airway & Oxygen Therapy: Patient Spontanous Breathing and Patient connected to face mask oxygen  Post-op Assessment: Report given to RN, Post -op Vital signs reviewed and stable and Patient moving all extremities X 4  Post vital signs: Reviewed and stable  Last Vitals:  Vitals Value Taken Time  BP 128/117 10/10/20 1437  Temp    Pulse 67 10/10/20 1438  Resp 12 10/10/20 1438  SpO2 100 % 10/10/20 1438  Vitals shown include unvalidated device data.  Last Pain:  Vitals:   10/10/20 0821  TempSrc: Oral  PainSc:       Patients Stated Pain Goal: 0 (00/37/94 4461)  Complications: No complications documented.

## 2020-10-10 NOTE — Progress Notes (Signed)
Neurosurgery Service Progress Note  Subjective: No acute events overnight, no new complaints   Objective: Vitals:   10/09/20 2341 10/10/20 0330 10/10/20 0400 10/10/20 0821  BP:   (!) 99/59   Pulse:   71   Resp:   14   Temp: 97.8 F (36.6 C) 97.8 F (36.6 C)  97.7 F (36.5 C)  TempSrc: Oral Oral  Oral  SpO2:   96%   Weight:      Height:        Physical Exam: AOx3, PERRL, +bitemporal hemianopsia R > L, EOMI, FS, TM, Strength 5/5 x4, SILTx4, no drift  Assessment & Plan: 64 y.o. woman w/ pituitary apoplexy, hyponatremia, non-functioning tumor.  -Na correcting well  -OR today for transphenoidal  -ICU overnight for close monitoring -regular diet post-op  Judith Part  10/10/20 10:44 AM

## 2020-10-10 NOTE — Brief Op Note (Signed)
10/10/2020  2:25 PM  PATIENT:  Rebecca Trevino  64 y.o. female  PRE-OPERATIVE DIAGNOSIS:  PITUITARY APOPLEXY  POST-OPERATIVE DIAGNOSIS:  PITUITARY APOPLEXY  PROCEDURE:  Procedure(s): ENDONASAL ENDOSCOPIC TRANSPHENOIDAL TUMOR RESECTION (N/A)  SURGEON:  Surgeon(s) and Role:    * Francely Craw, Joyice Faster, MD - Primary    * Jerrell Belfast, MD - Assisting  PHYSICIAN ASSISTANT:   ANESTHESIA:   general  EBL:  50 mL   BLOOD ADMINISTERED:none  DRAINS: none   LOCAL MEDICATIONS USED:  LIDOCAINE   SPECIMEN:  Pituitary adenoma  DISPOSITION OF SPECIMEN:  PATHOLOGY  COUNTS:  YES  TOURNIQUET:  * No tourniquets in log *  DICTATION: .Note written in EPIC  PLAN OF CARE: Admit to inpatient   PATIENT DISPOSITION:  PACU - hemodynamically stable.   Delay start of Pharmacological VTE agent (>24hrs) due to surgical blood loss or risk of bleeding: yes

## 2020-10-10 NOTE — Op Note (Signed)
PATIENT: Rebecca Trevino  DAY OF SURGERY: 10/10/20   PRE-OPERATIVE DIAGNOSIS:  Pituitary adenoma   POST-OPERATIVE DIAGNOSIS:  Pituitary adenoma   PROCEDURE:  Endonasal endoscopic transphenoidal resection of pituitary tumor   SURGEON:  Surgeon(s) and Role:    Judith Part, MD - Co-surgeon    Jerrell Belfast, MD - Co-surgeon   ANESTHESIA: ETGA   BRIEF HISTORY: This is a 64 year old woman who presented with headaches, vomiting, and bitemporal hemianopsia. The patient was found to have a large pituitary mass with evidence of apoplexy with normal prolactin level. Her severe hyponatremia was treated and corrected well. Given the above, I recommended resection of her pituitary tumor via a transphenoidal route. This was discussed with the patient as well as risks, benefits, and alternatives and wished to proceed with surgical treatment. Given the evidence of apoplexy, in addition to normal risks of surgery, we discussed the potential for difficulty in decompressing the chiasm due to acute / adherent clot and increased difficulty in obtaining a pathologic diagnosis due to heme products.   OPERATIVE DETAIL: Dr. Wilburn Cornelia performed the nasal portion of the approach and is dictated separately.   For the neurosurgical portion of the case, Dr. Wilburn Cornelia and I used a combination of visual anatomic landmarks and frameless stereotaxy to confirm orientation and anatomy. The dura of the sella was then opened sharply with a #11 blade in a trap door fashion. The mass was dense, fibrous, and adherent. It took considerable effort to dissect free from the walls and mobilize. Eventually, I was able to free the edges and dissect it circumferentially, then remove it piecemeal. Afterwards, the diaphragma fell into the field and was protruding some from the face of the sphenoid. I placed some floseal into the sella with a cottonoid to help prevent sag of the chiasm. There was no evidence of CSF leakage. Hemostasis was  confirmed and Adheris (Stryker) was used to fill the sella and seal the defect. Dr. Wilburn Cornelia then took over to complete the skull base reconstruction portion of the procedure.   EBL:  25mL   DRAINS: none   SPECIMENS: Pituitary tumor   Judith Part, MD 10/10/20 11:15 AM

## 2020-10-10 NOTE — Anesthesia Procedure Notes (Signed)
Arterial Line Insertion Start/End12/13/2021 10:25 AM, 10/10/2020 10:27 AM Performed by: Renato Shin, CRNA  Patient location: Pre-op. Preanesthetic checklist: patient identified, IV checked, risks and benefits discussed and surgical consent Lidocaine 1% used for infiltration Right, radial was placed Catheter size: 20 G Hand hygiene performed  and maximum sterile barriers used   Attempts: 2 Procedure performed without using ultrasound guided technique. Following insertion, Biopatch. Post procedure assessment: normal

## 2020-10-10 NOTE — Progress Notes (Signed)
Pt belongings at bedside: Personal bag Blanket Dentures Clothing Toiletries   Wearing yellow-colored ring on left hand  States husband has phone and wallet.

## 2020-10-10 NOTE — Anesthesia Procedure Notes (Signed)

## 2020-10-10 NOTE — Anesthesia Preprocedure Evaluation (Signed)
Anesthesia Evaluation  Patient identified by MRN, date of birth, ID band Patient awake    Reviewed: Allergy & Precautions, NPO status , Patient's Chart, lab work & pertinent test results  Airway Mallampati: II  TM Distance: >3 FB Neck ROM: Full    Dental  (+) Teeth Intact   Pulmonary neg pulmonary ROS,    Pulmonary exam normal breath sounds clear to auscultation       Cardiovascular hypertension, Pt. on medications Normal cardiovascular exam Rhythm:Regular Rate:Normal     Neuro/Psych Pituitary apoplexy negative psych ROS   GI/Hepatic Neg liver ROS, GERD  Medicated,  Endo/Other  negative endocrine ROS  Renal/GU negative Renal ROS     Musculoskeletal negative musculoskeletal ROS (+)   Abdominal   Peds  Hematology negative hematology ROS (+)   Anesthesia Other Findings Day of surgery medications reviewed with the patient.  Reproductive/Obstetrics                             Anesthesia Physical Anesthesia Plan  ASA: III  Anesthesia Plan: General   Post-op Pain Management:    Induction: Intravenous  PONV Risk Score and Plan: 3 and Midazolam, Dexamethasone and Ondansetron  Airway Management Planned: Oral ETT  Additional Equipment: Arterial line  Intra-op Plan:   Post-operative Plan: Extubation in OR  Informed Consent: I have reviewed the patients History and Physical, chart, labs and discussed the procedure including the risks, benefits and alternatives for the proposed anesthesia with the patient or authorized representative who has indicated his/her understanding and acceptance.       Plan Discussed with: CRNA  Anesthesia Plan Comments: (2nd PIV)        Anesthesia Quick Evaluation

## 2020-10-10 NOTE — Op Note (Signed)
Operative Note:  ENDOSCOPIC TRANSSPHENOIDAL PITUITARY RESECTION WITH NAVIGATION      Patient: Rebecca Trevino  Medical record number: [id number]  Date:10/10/2020  Pre-operative Indications: 1.  Pituitary Mass       Postoperative Indications: Same  Surgical Procedure: 1. Endoscopic transsphenoidal pituitary resection with intraoperative navigation    2.  Bilateral sphenoidotomy      Anesthesia: GET  Surgeon: Delsa Bern, M.D.  Neurosurgeon: Dr. Zada Finders  Complications: None  EBL: 50 cc  Findings: Mild nasal septal deviation, no evidence of significant sinonasal disease, infection or polyps.  No significant bleeding or CSF leak. Naso-pore sphenoid packing placed.  Note: The neurosurgical component of the operative procedure is dictated as a separate operative note.   Brief History: The patient is a 64 y.o. female with a history of pituitary mass. The patient has a history of severe headache, nausea and vomiting and visual change.  Patient admitted to [facility] for evaluation of bilateral hemianopsia diagnosed by her ophthalmologist.  Work-up including MRI scan showed findings consistent with pituitary adenoma with acute hemorrhage and significant superior extension.  Dr. Zada Finders was consulted for neurosurgical evaluation.  Patient seen by me as a hospital consult preoperatively with review of nasal anatomy and sinus CT scan for navigation.  Given the patient's history and findings, the above surgical procedures were recommended, risks and benefits were discussed in detail with the patient.  They understand and agree with our plan for surgery which is scheduled at [facility] under general anesthesia.  Surgical Procedure: The patient is brought to the neurosurgical operating room on 10/10/2020 and placed in supine position on the operating table. General endotracheal anesthesia was established without difficulty. When the patient was adequately anesthetized, surgical  timeout was performed with correct identification of the patient and the surgical procedure. The patient's nose was then injected with 6 cc of 1% lidocaine 1:100,000 dilution epinephrine which was injected in a submucosal fashion. The patient's nose was then packed with Afrin-soaked cottonoid pledgets were left in place for approximately 10 minutes to allow for vasoconstriction and hemostasis.  The Xomed Fusion navigation headgear was applied and anatomic and surgical landmarks were identified and confirmed, navigation was used throughout the sinus component of the surgical procedure.  With the patient prepped draped and prepared for surgery, nasal endoscopy was performed on the patient's right.  The middle turbinate was carefully lateralized to allow access to the posterior aspect of the nasal passageway.  The right sphenoid sinus ostium was identified.  The inferior aspect of the superior turbinate was then resected with through-cutting forceps and a microdebrider.  The right sphenoid sinus ostium was enlarged in a superior and lateral direction using the microdebrider and through-cutting forceps to create a widely patent ostium.  Nasal endoscopy on the patient's left-hand side was then undertaken.  The left middle turbinate was lateralized and the posterior nasal cavity was visualized with identification of the left sphenoid sinus ostium using navigation.  The inferior aspect of the superior turbinate was resected and the sinus ostium was enlarged in the lateral and superior direction to create a wide sphenoid sinus ostium.  A posterior septectomy was then performed with a Surveyor, quantity.  Bone, cartilage and soft tissue was then resected to create a wide posterior septotomy.  The anterior face of the sphenoid sinus and sphenoid sinus septum were then resected with a combination of through-cutting forceps, osteotome and microdebrider to allow direct access to the entire posterior aspect of the sphenoid sinus  and pituitary fossa.  Sphenoid sinus mucosa overlying the pituitary fossa was elevated and lateralized.  The anterior face of the pituitary fossa was demarcated using navigation.  With adequate access to the pituitary fossa the neurosurgical component of the procedure was begun by Dr. Zada Finders.  This is dictated as a separate operative report.  Resection of the pituitary tumor was undertaken using direct visualization of the 0 degree endoscope, navigation and blunt and sharp dissection.  With pituitary tumor resection completed, reconstruction was undertaken.  The anterior sellar defect was closed with FloSeal and Adheris dural glue.  Surgicel was placed over the pituitary fossa defect and the sphenoid sinus was loosely packed with Naso-pore absorbable nasal packing.  There was no active bleeding and no evidence of spinal fluid leak.  The sphenoid sinus was carefully inspected, no further bleeding along the mucosal margins, sphenoidotomy sites or posterior septectomy.  The patient's nasal cavity was irrigated and suctioned.  Surgical sponge count was correct. An oral gastric tube was passed and the stomach contents were aspirated. Patient was awakened from anesthetic and transferred from the operating room to the recovery room in stable condition. There were no complications and blood loss was 50 cc.   Delsa Bern, M.D. [facility] 10/10/2020

## 2020-10-10 NOTE — Progress Notes (Signed)
Glasses taken to PACU. Wedding band remains on. Dr. Gifford Shave and Lynnae Sandhoff, CRNA aware.

## 2020-10-10 NOTE — Anesthesia Postprocedure Evaluation (Signed)
Anesthesia Post Note  Patient: Rebecca Trevino  Procedure(s) Performed: ENDONASAL ENDOSCOPIC TRANSPHENOIDAL TUMOR RESECTION (N/A Nose)     Patient location during evaluation: PACU Anesthesia Type: General Level of consciousness: awake and alert Pain management: pain level controlled Vital Signs Assessment: post-procedure vital signs reviewed and stable Respiratory status: spontaneous breathing, nonlabored ventilation and respiratory function stable Cardiovascular status: blood pressure returned to baseline and stable Postop Assessment: no apparent nausea or vomiting Anesthetic complications: no   No complications documented.  Last Vitals:  Vitals:   10/10/20 1555 10/10/20 1600  BP: 130/78 (!) 141/84  Pulse: 78 71  Resp: 13 15  Temp:  (!) 35.7 C  SpO2: 99% 100%    Last Pain:  Vitals:   10/10/20 1639  TempSrc:   PainSc: Asleep                 Catalina Gravel

## 2020-10-11 ENCOUNTER — Encounter (HOSPITAL_COMMUNITY): Payer: Self-pay | Admitting: Neurological Surgery

## 2020-10-11 LAB — RENAL FUNCTION PANEL
Albumin: 3 g/dL — ABNORMAL LOW (ref 3.5–5.0)
Albumin: 3.3 g/dL — ABNORMAL LOW (ref 3.5–5.0)
Albumin: 3.3 g/dL — ABNORMAL LOW (ref 3.5–5.0)
Albumin: 3.5 g/dL (ref 3.5–5.0)
Anion gap: 9 (ref 5–15)
Anion gap: 10 (ref 5–15)
Anion gap: 10 (ref 5–15)
Anion gap: 7 (ref 5–15)
BUN: <5 mg/dL — ABNORMAL LOW (ref 8–23)
BUN: 5 mg/dL — ABNORMAL LOW (ref 8–23)
BUN: 7 mg/dL — ABNORMAL LOW (ref 8–23)
BUN: 8 mg/dL (ref 8–23)
CO2: 21 mmol/L — ABNORMAL LOW (ref 22–32)
CO2: 22 mmol/L (ref 22–32)
CO2: 21 mmol/L — ABNORMAL LOW (ref 22–32)
CO2: 23 mmol/L (ref 22–32)
Calcium: 8.1 mg/dL — ABNORMAL LOW (ref 8.9–10.3)
Calcium: 8.7 mg/dL — ABNORMAL LOW (ref 8.9–10.3)
Calcium: 9.1 mg/dL (ref 8.9–10.3)
Calcium: 9.2 mg/dL (ref 8.9–10.3)
Chloride: 93 mmol/L — ABNORMAL LOW (ref 98–111)
Chloride: 97 mmol/L — ABNORMAL LOW (ref 98–111)
Chloride: 101 mmol/L (ref 98–111)
Chloride: 104 mmol/L (ref 98–111)
Creatinine, Ser: 0.6 mg/dL (ref 0.44–1.00)
Creatinine, Ser: 0.71 mg/dL (ref 0.44–1.00)
Creatinine, Ser: 0.78 mg/dL (ref 0.44–1.00)
Creatinine, Ser: 0.64 mg/dL (ref 0.44–1.00)
GFR, Estimated: >60 mL/min (ref >60)
GFR, Estimated: >60 mL/min (ref >60)
GFR, Estimated: >60 mL/min (ref >60)
GFR, Estimated: >60 mL/min (ref >60)
Glucose, Bld: 137 mg/dL — ABNORMAL HIGH (ref 70–99)
Glucose, Bld: 149 mg/dL — ABNORMAL HIGH (ref 70–99)
Glucose, Bld: 134 mg/dL — ABNORMAL HIGH (ref 70–99)
Glucose, Bld: 138 mg/dL — ABNORMAL HIGH (ref 70–99)
Phosphorus: 3 mg/dL (ref 2.5–4.6)
Phosphorus: 2.9 mg/dL (ref 2.5–4.6)
Phosphorus: 2.4 mg/dL — ABNORMAL LOW (ref 2.5–4.6)
Phosphorus: 2.3 mg/dL — ABNORMAL LOW (ref 2.5–4.6)
Potassium: 3.9 mmol/L (ref 3.5–5.1)
Potassium: 3.3 mmol/L — ABNORMAL LOW (ref 3.5–5.1)
Potassium: 3.7 mmol/L (ref 3.5–5.1)
Potassium: 4.1 mmol/L (ref 3.5–5.1)
Sodium: 123 mmol/L — ABNORMAL LOW (ref 135–145)
Sodium: 129 mmol/L — ABNORMAL LOW (ref 135–145)
Sodium: 132 mmol/L — ABNORMAL LOW (ref 135–145)
Sodium: 134 mmol/L — ABNORMAL LOW (ref 135–145)

## 2020-10-11 LAB — OSMOLALITY, URINE: Osmolality, Ur: 73 mOsm/kg — ABNORMAL LOW (ref 300–900)

## 2020-10-11 LAB — OSMOLALITY: Osmolality: 273 mOsm/kg — ABNORMAL LOW (ref 275–295)

## 2020-10-11 LAB — SODIUM, URINE, RANDOM: Sodium, Ur: <10 mmol/L

## 2020-10-11 MED ORDER — PREDNISONE 20 MG PO TABS: 20.0000 mg | ORAL_TABLET | Freq: Two times a day (BID) | ORAL | Status: DC

## 2020-10-11 MED ORDER — POTASSIUM CHLORIDE CRYS ER 20 MEQ PO TBCR
40.0000 meq | EXTENDED_RELEASE_TABLET | Freq: Once | ORAL | Status: AC
  Administered 2020-10-11: 40 meq via ORAL
  Filled 2020-10-11: qty 2

## 2020-10-11 MED ORDER — PREDNISONE 20 MG PO TABS
20.0000 mg | ORAL_TABLET | Freq: Two times a day (BID) | ORAL | Status: DC
Start: 2020-10-11 — End: 2020-10-12
  Administered 2020-10-11 – 2020-10-12 (×3): 20 mg via ORAL
  Filled 2020-10-11 (×3): qty 1

## 2020-10-11 NOTE — Evaluation (Signed)
Occupational Therapy Evaluation Patient Details Name: Rebecca Trevino MRN: [id number] DOB: 1956-06-13 Today's Date: 10/11/2020    History of Present Illness 64 yo woman with hx of HTN and GERD, here with  blurred vision x 2 weeks, headaches, nausea/vomiting, sent from [facility] due to bitemporal hemianopsia.  Found to have 2.9 cm round, intra-sellar mass  with suprasellar extension. Now s/p Endoscopic transsphenoidal pituitary resection with intraoperative navigation, and bil Sphenoidotomy   Clinical Impression   This 64 y/o female presents with the above. PTA pt reports being independent with ADL and functional mobility. Pt very pleasant and willing to participate in therapy session. Pt tolerating short distance mobility in room, overall requiring two person HHA throughout, requiring up to modA (+2) for LB ADL. Pt reports visual deficits have notably improved and with mild dizziness with mobility tasks today (most notably with looking down). VSS. She will benefit from continued acute OT services and recommend follow up [place] services to progress pt towards her PLOF.     Follow Up Recommendations  Home health OT;Supervision/Assistance - 24 hour    Equipment Recommendations  3 in 1 bedside commode (for use in shower)           Precautions / Restrictions Precautions Precautions: Fall Restrictions Weight Bearing Restrictions: No      Mobility Bed Mobility Overal bed mobility: Needs Assistance Bed Mobility: Supine to Sit     Supine to sit: Min assist     General bed mobility comments: light assist to scoot towards EOB    Transfers Overall transfer level: Needs assistance Equipment used: 2 person hand held assist Transfers: Sit to/from [employer] Sit to Stand: Min assist;+2 physical assistance;+2 safety/equipment Stand pivot transfers: Min assist;+2 physical assistance;+2 safety/equipment       General transfer comment: +2 steadying assist throughout via  HHA    Balance Overall balance assessment: Needs assistance Sitting-balance support: Feet supported Sitting balance-Leahy Scale: Fair     Standing balance support: Bilateral upper extremity supported Standing balance-Leahy Scale: Poor                             ADL either performed or assessed with clinical judgement   ADL Overall ADL's : Needs assistance/impaired Eating/Feeding: Modified independent;Sitting   Grooming: Set up;Sitting   Upper Body Bathing: Set up;Sitting   Lower Body Bathing: Moderate assistance;Sit to/from stand   Upper Body Dressing : Min guard;Sitting   Lower Body Dressing: Moderate assistance;Sit to/from stand;+2 for safety/equipment Lower Body Dressing Details (indicate cue type and reason): pt with increased dizziness with looking down, ultimately required assist to adjust socks Toilet Transfer: Minimal assistance;+2 for physical assistance;+2 for safety/equipment Toilet Transfer Details (indicate cue type and reason): short distance mobility (approx 4') in room Toileting- Clothing Manipulation and Hygiene: Moderate assistance;Sit to/from stand;+2 for safety/equipment       Functional mobility during ADLs: Minimal assistance;+2 for physical assistance;+2 for safety/equipment (HHA)       Vision Baseline Vision/History: Wears glasses Wears Glasses: At all times Patient Visual Report: Other (comment) (initial visual deficits have since subsided) Additional Comments: will continue to assess, pt reports initial field cut to R, reports has improved     Perception     Praxis      Pertinent Vitals/Pain Pain Assessment: Faces Faces Pain Scale: Hurts a little bit Pain Location: headache Pain Descriptors / Indicators: Discomfort;Headache Pain Intervention(s): Monitored during session;Repositioned     Hand Dominance  Extremity/Trunk Assessment Upper Extremity Assessment Upper Extremity Assessment: Overall WFL for tasks assessed  (mild edema LUE due to infiltration)   Lower Extremity Assessment Lower Extremity Assessment: Defer to PT evaluation   Cervical / Trunk Assessment Cervical / Trunk Assessment: Normal   Communication Communication Communication: No difficulties   Cognition Arousal/Alertness: Awake/alert Behavior During Therapy: WFL for tasks assessed/performed Overall Cognitive Status: Within Functional Limits for tasks assessed                                     General Comments  VSS on RA    Exercises     Shoulder Instructions      Home Living Family/patient expects to be discharged to:: Private residence Living Arrangements: Spouse/significant other Available Help at Discharge: Family Type of Home: House Home Access: Stairs to enter [employer] of Steps: threshold from garage   Home Layout: Two level Alternate Level Stairs-Number of Steps: 14-16 Alternate Level Stairs-Rails: Right;Left (both initially, then only R side) Bathroom Shower/Tub: Walk-in shower;Tub/shower unit (typically uses tub/shower)   Bathroom Toilet: Handicapped height     Home Equipment: Walker - 2 wheels          Prior Functioning/Environment Level of Independence: Independent        Comments: driving, working        OT Problem List: Decreased strength;Decreased range of motion;Decreased activity tolerance;Impaired balance (sitting and/or standing);Impaired vision/perception;Decreased knowledge of precautions      OT Treatment/Interventions: Self-care/ADL training;Therapeutic exercise;Energy conservation;DME and/or AE instruction;Therapeutic activities;Cognitive remediation/compensation;Patient/family education;Balance training;Visual/perceptual remediation/compensation    OT Goals(Current goals can be found in the care plan section) Acute Rehab OT Goals Patient Stated Goal: return to independent OT Goal Formulation: With patient Time For Goal Achievement:  10/25/20 Potential to Achieve Goals: Good  OT Frequency: Min 2X/week   Barriers to D/C:            Co-evaluation PT/OT/SLP Co-Evaluation/Treatment: Yes Reason for Co-Treatment: To address functional/ADL transfers;For patient/therapist safety   OT goals addressed during session: ADL's and self-care      AM-PAC OT "6 Clicks" Daily Activity     Outcome Measure Help from another person eating meals?: None Help from another person taking care of personal grooming?: A Little Help from another person toileting, which includes using toliet, bedpan, or urinal?: A Lot Help from another person bathing (including washing, rinsing, drying)?: A Lot Help from another person to put on and taking off regular upper body clothing?: A Little Help from another person to put on and taking off regular lower body clothing?: A Lot 6 Click Score: 16   End of Session Nurse Communication: Mobility status  Activity Tolerance: Patient tolerated treatment well Patient left: in chair;with call bell/phone within reach;with chair alarm set  OT Visit Diagnosis: Dizziness and giddiness (R42);Other abnormalities of gait and mobility (R26.89);Muscle weakness (generalized) (M62.81);Other symptoms and signs involving the nervous system (R29.898)                Time: 1350-1419 OT Time Calculation (min): 29 min Charges:  OT General Charges $OT Visit: 1 Visit OT Evaluation $OT Eval Moderate Complexity: [place], OT Acute Rehabilitation Services Pager [phone] Office [phone]   Raymondo Band 10/11/2020, 2:32 PM

## 2020-10-11 NOTE — Progress Notes (Addendum)
NAME:  Rebecca Trevino, MRN:  [id number], DOB:  [birth year], LOS: 3 ADMISSION DATE:  10/07/2020, CONSULTATION DATE:  10/07/20  REFERRING MD:  Wyn Quaker, ED physician, CHIEF COMPLAINT:   Blurred vision x 2 weeks, headaches, nausea/vomiting  Brief History   64 yo woman with hx of HTN and GERD, here with  blurred vision x 2 weeks, headaches, nausea/vomiting, sent from [place] [facility] due to bitemporal hemianopsia.  Found to have 2.9 cm round, intra-sellar mass with suprasellar extension.   Past Medical History  HTN  GERD  Significant Hospital Events    Consults:  Neurosurgery  Procedures:    Significant Diagnostic Tests:  MRI  1. Truncated exam demonstrates at 2.9 cm round, intra-sellar mass with suprasellar extension. Associated effaced suprasellar cistern and mass effect on the optic chiasm.  2. Favor Pituitary Macroadenoma at this point but recommend repeat MRI Head using Pituitary Protocol (without and with contrast) when the patient can tolerate for definitive imaging characterization and treatment planning.  MRI Sella Protocol 1. Up to 28 mm heterogeneously enhancing intrasellar mass with suprasellar extension most compatible with Pituitary Macroadenoma. There is susceptibility within the mass which might indicate prior hemorrhage, but homogeneous pre-contrast T1 signal argues against recent bleeding. Regional mass effect including splaying of surrounding vessels, compression of the optic chiasm. No overt cavernous sinus involvement at this time.  2. No other acute intracranial abnormality. Moderately advanced signal changes in the cerebral white matter, and solitary chronic microhemorrhage in the cerebellum, most likely due to chronic small vessel disease.   Micro Data:  10/07/20 COVId/flu neg  Antimicrobials:    Interim history/subjective:  O/N events: No acute events  Rebecca Trevino was examined and evaluated at bedside this am. She mentions improvement  in her headache. Denies any blurry vision, vomiting. Had 1 minor episode of nausea. Urinating well. Passing flatus. Denies any chest pain, palpitations, dyspnea. Post-op pain is well controlled.  Objective   Blood pressure 106/71, pulse 93, temperature 98.6 F (37 C), temperature source Oral, resp. rate 18, height 5\' 6"  (1.676 m), weight 69.6 kg, SpO2 99 %.        Intake/Output Summary (Last 24 hours) at 10/11/2020 0703 Last data filed at 10/11/2020 0600 Gross per 24 hour  Intake 1649.94 ml  Output 985 ml  Net 664.94 ml   Filed Weights   10/07/20 1539 10/08/20 0430 10/09/20 0500  Weight: 70.3 kg 69.9 kg 69.6 kg   Physical Exam: General this is a very pleasant 64 year old female currently sitting up in chair HENT NCAT no JVD PERRL Pulm clear no accessory use  Card RRR abd soft not tender.  Ext warm and dry  Neuro awake and alert. 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 Post-op day 1. Denies significant post-op pain. No complications during OR per OP note - Appreciate neurosurg / ENT recs - Neurochecks  #Hyponatremia  #Secondary adrenal insufficiency Due to secondary adrenal insufficiency in setting of pituitary macroadenoma. With low ACTH and cortisol. S/p stress dose steroids. Overnight Na fluctuating 130->132->127 - C/w monitor - C/w prednisone 10mg  daily, may need to prolong taper if worsening hyponatremia/bp - C/w synthroid 23mcg  Best practice (evaluated daily)   Diet: Soft Pain/Anxiety/Delirium protocol (if indicated): Dilaudid, Norco VAP protocol (if indicated): N/A DVT prophylaxis: SCDs GI prophylaxis: pepcid Glucose control: Mobility:  last date of  multidisciplinary goals of care discussion Family and staff present  Summary of discussion  Follow up goals of care discussion due Code  Status: Full Code Disposition: 4N  Mosetta Anis, MD 10/11/2020, 7:37 AM PGY-3, [place] [facility] Pager: [phone]

## 2020-10-11 NOTE — Evaluation (Signed)
Physical Therapy Evaluation Patient Details Name: Aashvi Rezabek MRN: [id number] DOB: 1956/06/09 Today's Date: 10/11/2020   History of Present Illness  64 yo woman with hx of HTN and GERD, here with  blurred vision x 2 weeks, headaches, nausea/vomiting, sent from [facility] due to bitemporal hemianopsia.  Found to have 2.9 cm round, intra-sellar mass  with suprasellar extension. Now s/p Endoscopic transsphenoidal pituitary resection with intraoperative navigation, and bil Sphenoidotomy  Clinical Impression   Patient is s/p above surgery resulting in functional limitations due to the deficits listed below (see PT Problem List). Comes from home where she lives with her husband (and two birds) in a two story house with a small step to enter; bedroom and bathroom upstairs, 14-16 steps to access with rail; Independent at baseline; Presents to therapies with generalized weakness, unsteadiness on feet; noteworthy that she has distinct dizziness when she looks down;  I anticipate good progress with functional mobility; Patient will benefit from skilled PT to increase their independence and safety with mobility to allow discharge to the venue listed below.       Follow Up Recommendations Home health PT    Equipment Recommendations  Rolling walker with 5" wheels;3in1 (PT);Other (comment) (may progress well enough to not need RW)    Recommendations for Other Services       Precautions / Restrictions Precautions Precautions: Fall Restrictions Weight Bearing Restrictions: No      Mobility  Bed Mobility Overal bed mobility: Needs Assistance Bed Mobility: Supine to Sit     Supine to sit: Min assist     General bed mobility comments: light assist to scoot towards EOB    Transfers Overall transfer level: Needs assistance Equipment used: 2 person hand held assist Transfers: Sit to/from [employer] Transfers Sit to Stand: Min assist;+2 physical assistance;+2 safety/equipment Stand  pivot transfers: Min assist;+2 physical assistance;+2 safety/equipment       General transfer comment: +2 steadying assist throughout via [facility]  Ambulation/Gait                Stairs            Wheelchair Mobility    Modified Rankin (Stroke Patients Only)       Balance Overall balance assessment: Needs assistance Sitting-balance support: Feet supported Sitting balance-Leahy Scale: Fair     Standing balance support: Bilateral upper extremity supported Standing balance-Leahy Scale: Poor                               Pertinent Vitals/Pain Pain Assessment: Faces Faces Pain Scale: Hurts a little bit Pain Location: headache Pain Descriptors / Indicators: Discomfort;Headache Pain Intervention(s): Monitored during session    Home Living Family/patient expects to be discharged to:: Private residence Living Arrangements: Spouse/significant other Available Help at Discharge: Family Type of Home: House Home Access: Stairs to enter   [employer] of Steps: threshold from garage Home Layout: Two level [place]: Environmental consultant - 2 wheels      Prior Function Level of Independence: Independent         Comments: driving, working     Journalist, newspaper        Extremity/Trunk Assessment   Upper Extremity Assessment Upper Extremity Assessment: Defer to OT evaluation    Lower Extremity Assessment Lower Extremity Assessment: Generalized weakness    Cervical / Trunk Assessment Cervical / Trunk Assessment: Normal  Communication   Communication: No difficulties  Cognition Arousal/Alertness: Awake/alert Behavior During  Therapy: WFL for tasks assessed/performed Overall Cognitive Status: Within Functional Limits for tasks assessed                                        General Comments General comments (skin integrity, edema, etc.): VSS on RA    Exercises     Assessment/Plan    PT Assessment Patient needs continued  PT services  PT Problem List Decreased strength;Decreased range of motion;Decreased activity tolerance;Decreased balance;Decreased mobility;Decreased coordination;Decreased knowledge of use of DME;Decreased safety awareness       PT Treatment Interventions DME instruction;Gait training;Stair training;Functional mobility training;Therapeutic activities;Therapeutic exercise;Balance training;Neuromuscular re-education;Patient/family education    PT Goals (Current goals can be found in the Care Plan section)  Acute Rehab PT Goals Patient Stated Goal: return to independent PT Goal Formulation: With patient Time For Goal Achievement: 10/25/20 Potential to Achieve Goals: Good    Frequency Min 3X/week   Barriers to discharge   Flight of steps to access bedroom and bathroom    Co-evaluation PT/OT/SLP Co-Evaluation/Treatment: Yes Reason for Co-Treatment: To address functional/ADL transfers PT goals addressed during session: Mobility/safety with mobility OT goals addressed during session: ADL's and self-care       AM-PAC PT "6 Clicks" Mobility  Outcome Measure Help needed turning from your back to your side while in a flat bed without using bedrails?: A Little Help needed moving from lying on your back to sitting on the side of a flat bed without using bedrails?: A Little Help needed moving to and from a bed to a chair (including a wheelchair)?: A Little Help needed standing up from a chair using your arms (e.g., wheelchair or bedside chair)?: A Little Help needed to walk in hospital room?: A Little Help needed climbing 3-5 steps with a railing? : A Little 6 Click Score: 18    End of Session   Activity Tolerance: Patient tolerated treatment well Patient left: in chair;with call bell/phone within reach Nurse Communication: Mobility status PT Visit Diagnosis: Unsteadiness on feet (R26.81);Other abnormalities of gait and mobility (R26.89)    Time: 9373-4287 PT Time Calculation (min)  (ACUTE ONLY): 28 min   Charges:   PT Evaluation $PT Eval Moderate Complexity: 1 Mod          Roney [place], [place]  Acute Rehabilitation Services Pager [phone] Office [phone]   Colletta [place] 10/11/2020, 4:32 PM

## 2020-10-11 NOTE — Progress Notes (Signed)
Pt urine output >250cc for last hour. Torrie Mayers, PA made aware. Sodium 134. No new orders at this time. RN will continue to monitor.

## 2020-10-11 NOTE — Progress Notes (Addendum)
Neurosurgery Service Progress Note  Subjective: No acute events overnight, vision significantly improved from preop, minimal headache, no rhinorrhea  Objective: Vitals:   10/11/20 0600 10/11/20 0700 10/11/20 0800 10/11/20 0900  BP: 106/71 113/73 108/67 (!) 115/98  Pulse: 93 96 88 (!) 103  Resp: 18 16 13 16   Temp:   98.7 F (37.1 C)   TempSrc:   Oral   SpO2: 99% 98% 97% 96%  Weight:      Height:        Physical Exam: AOx3, PERRL, +mild bitemporal hemianopsia roughly symmetric, EOMI, FS, TM, Strength 5/5 x4, SILTx4, no drift  Assessment & Plan: 64 y.o. woman w/ pituitary apoplexy, hyponatremia, non-functioning tumor. 12/13 s/p endoscopic endonasal TSA   -final path pending -hyponatremia persists, will have to keep in the ICU in case she needs hypertonic treatment -formal labs pending to confirm diagnosis of SIADH, but this is very atypical, especially post-op, does not look like a triple phase response, but nonetheless her Na is clearly dropping and BUN is off the charts. I assume she has SIADH and when she got NS / LR she held onto the free water. Will start fluid restriction 750cc/24h -simultaneous Urine & serum Na & osm to eval for SIADH -given her Na dropping, we should hold off on switching the prednisone to hydrocortisone to avoid confusing the picture, can consider fludricortisone if fluid restriction isn't cutting it, clinically does not look like she has addison's or hypothyroidism -regular diet with oral fluid restriction 750cc/24h -can keep the foley for strict I/O & A-line for such frequent blood draws for another 24h -SCDs/TEDs, [facility] tomorrow  Judith Part  10/11/20 9:21 AM

## 2020-10-12 LAB — CBC
HCT: 30.5 % — ABNORMAL LOW (ref 36.0–46.0)
Hemoglobin: 10.1 g/dL — ABNORMAL LOW (ref 12.0–15.0)
MCH: 29.3 pg (ref 26.0–34.0)
MCHC: 33.1 g/dL (ref 30.0–36.0)
MCV: 88.4 fL (ref 80.0–100.0)
Platelets: 272 10*3/uL (ref 150–400)
RBC: 3.45 MIL/uL — ABNORMAL LOW (ref 3.87–5.11)
RDW: 13.6 % (ref 11.5–15.5)
WBC: 9.2 10*3/uL (ref 4.0–10.5)
nRBC: 0 % (ref 0.0–0.2)

## 2020-10-12 LAB — RENAL FUNCTION PANEL
Albumin: 3.2 g/dL — ABNORMAL LOW (ref 3.5–5.0)
Anion gap: 8 (ref 5–15)
BUN: 6 mg/dL — ABNORMAL LOW (ref 8–23)
CO2: 25 mmol/L (ref 22–32)
Calcium: 9.1 mg/dL (ref 8.9–10.3)
Chloride: 105 mmol/L (ref 98–111)
Creatinine, Ser: 0.7 mg/dL (ref 0.44–1.00)
GFR, Estimated: >60 mL/min (ref >60)
Glucose, Bld: 108 mg/dL — ABNORMAL HIGH (ref 70–99)
Phosphorus: 2.9 mg/dL (ref 2.5–4.6)
Potassium: 3.8 mmol/L (ref 3.5–5.1)
Sodium: 138 mmol/L (ref 135–145)

## 2020-10-12 LAB — SODIUM: Sodium: 137 mmol/L (ref 135–145)

## 2020-10-12 LAB — MAGNESIUM: Magnesium: 1.8 mg/dL (ref 1.7–2.4)

## 2020-10-12 LAB — SURGICAL PATHOLOGY

## 2020-10-12 MED ORDER — HYDROCORTISONE 5 MG PO TABS
5.0000 mg | ORAL_TABLET | Freq: Every day | ORAL | Status: DC
  Administered 2020-10-12: 5 mg via ORAL
  Filled 2020-10-12 (×2): qty 1

## 2020-10-12 MED ORDER — MAGNESIUM SULFATE 2 GM/50ML IV SOLN
2.0000 g | Freq: Once | INTRAVENOUS | Status: AC
  Administered 2020-10-12: 2 g via INTRAVENOUS
  Filled 2020-10-12: qty 50

## 2020-10-12 MED ORDER — HYDROCORTISONE 10 MG PO TABS
10.0000 mg | ORAL_TABLET | Freq: Every day | ORAL | Status: DC
  Administered 2020-10-13: 10 mg via ORAL
  Filled 2020-10-12: qty 1

## 2020-10-12 NOTE — Progress Notes (Signed)
Physical Therapy Treatment Patient Details Name: Rebecca Trevino MRN: [id number] DOB: 25-May-1956 Today's Date: 10/12/2020    History of Present Illness 64 yo woman with hx of HTN and GERD, here with  blurred vision x 2 weeks, headaches, nausea/vomiting, sent from [facility] due to bitemporal hemianopsia.  Found to have 2.9 cm round, intra-sellar mass  with suprasellar extension. Now s/p Endoscopic transsphenoidal pituitary resection with intraoperative navigation, and bil Sphenoidotomy    PT Comments    Continuing work on functional mobility and activity tolerance;  Excellent progress with activity tolerance, able to walk the unit with handheld assist; a few notable instances with erratic step width with turns; no overt notable dizziness with looking down today (will consider a Vestibular Specialist for next session); HR variable and got to the 120s (highest observed); has a flight of steps to access bedroom  Follow Up Recommendations  Outpatient PT vs HHPT     Equipment Recommendations  Rolling walker with 5" wheels;3in1 (PT);Other (comment) (may progress well enough to not need RW)    Recommendations for Other Services       Precautions / Restrictions Precautions Precautions: Fall Precaution Comments: Fall risk mild    Mobility  Bed Mobility                  Transfers Overall transfer level: Needs assistance Equipment used: 1 person hand held assist Transfers: Sit to/from Stand Sit to Stand: Min guard         General transfer comment: Min guard for safety assist  Ambulation/Gait Ambulation/Gait assistance: Min assist;Min guard Gait Distance (Feet): 500 Feet Assistive device: 1 person hand held assist (Handheld assist provided by husband) Gait Pattern/deviations: Step-through pattern (occasional erratic step width, esp with turns) Gait velocity: slowed   General Gait Details: Cues to self-monitor for activity tolerance; 2 bouts of what seemed like dizziness  (but pt did not characterize as dizziness); HR variable, ranged into 120s at the [employer] Mobility    Modified Rankin (Stroke Patients Only)       Balance             Standing balance-Leahy Scale: Fair                              Cognition Arousal/Alertness: Awake/alert Behavior During Therapy: WFL for tasks assessed/performed Overall Cognitive Status: Within Functional Limits for tasks assessed                                        Exercises      General Comments General comments (skin integrity, edema, etc.): HR variable throughout walk      Pertinent Vitals/Pain Pain Assessment: Faces Faces Pain Scale: Hurts a little bit Pain Location: headache Pain Descriptors / Indicators: Discomfort;Headache Pain Intervention(s): Monitored during session    Home Living                      Prior Function            PT Goals (current goals can now be found in the care plan section) Acute Rehab PT Goals Patient Stated Goal: return to independent PT Goal Formulation: With patient Time For Goal Achievement: 10/25/20 Potential to Achieve Goals: Good Progress towards PT goals:  Progressing toward goals    Frequency    Min 3X/week      PT Plan Current plan remains appropriate;Discharge plan needs to be updated (will consider Outpt PT vs HHPT)    Co-evaluation              AM-PAC PT "6 Clicks" Mobility   Outcome Measure  Help needed turning from your back to your side while in a flat bed without using bedrails?: A Little Help needed moving from lying on your back to sitting on the side of a flat bed without using bedrails?: A Little Help needed moving to and from a bed to a chair (including a wheelchair)?: A Little Help needed standing up from a chair using your arms (e.g., wheelchair or bedside chair)?: A Little Help needed to walk in hospital room?: A Little Help needed  climbing 3-5 steps with a railing? : A Little 6 Click Score: 18    End of Session   Activity Tolerance: Patient tolerated treatment well Patient left: in chair;with call bell/phone within reach Nurse Communication: Mobility status PT Visit Diagnosis: Unsteadiness on feet (R26.81);Other abnormalities of gait and mobility (R26.89)     Time: 1305-1330 PT Time Calculation (min) (ACUTE ONLY): 25 min  Charges:  $Gait Training: 23-37 mins                     Roney Marion, [place]  Acute Rehabilitation Services Pager [phone] Office [place] 10/12/2020, 4:03 PM

## 2020-10-12 NOTE — Progress Notes (Signed)
Neurosurgery Service Progress Note  Subjective: No acute events overnight, strong thirst drive  Objective: Vitals:   10/12/20 0700 10/12/20 0800 10/12/20 0811 10/12/20 0900  BP: 129/79 139/80  134/79  Pulse: 79   88  Resp: 15 12  19   Temp:   97.9 F (36.6 C)   TempSrc:   Oral   SpO2: 98% 98%  100%  Weight:      Height:        Physical Exam: AOx3, PERRL, +mild bitemporal hemianopsia roughly symmetric, EOMI, FS, TM, Strength 5/5 x4, SILTx4, no drift  Assessment & Plan: 64 y.o. woman w/ pituitary apoplexy, hyponatremia, non-functioning tumor. 12/13 s/p endoscopic endonasal TSA   -final path pending -Na improved, will liberalize to 1500cc/d, recheck Na q12h -transition prednisone to HCT -regular diet with oral fluid restriction 1500cc/24h -d/c foley and A-line -transfer to floor -SCDs/TEDs, SQH  Judith Part  10/12/20 9:27 AM

## 2020-10-12 NOTE — Progress Notes (Addendum)
NAME:  Rebecca Trevino, MRN:  [id number], DOB:  05/14/1956, LOS: 4 ADMISSION DATE:  10/07/2020, CONSULTATION DATE:  10/07/20  REFERRING MD:  Wyn Quaker, ED physician, CHIEF COMPLAINT:   Blurred vision x 2 weeks, headaches, nausea/vomiting  Brief History   64 yo woman with hx of HTN and GERD, here with  blurred vision x 2 weeks, headaches, nausea/vomiting, sent from [facility] due to bitemporal hemianopsia.  Found to have 2.9 cm round, intra-sellar mass with suprasellar extension.   Past Medical History  HTN  GERD  Significant Hospital Events   12/10 > Admission  Consults:  Neurosurgery  Procedures:  12/13 Pituitary resection  Significant Diagnostic Tests:  MRI  1. Truncated exam demonstrates at 2.9 cm round, intra-sellar mass with suprasellar extension. Associated effaced suprasellar cistern and mass effect on the optic chiasm.  2. Favor Pituitary Macroadenoma at this point but recommend repeat MRI Head using Pituitary Protocol (without and with contrast) when the patient can tolerate for definitive imaging characterization and treatment planning.  MRI Sella Protocol 1. Up to 28 mm heterogeneously enhancing intrasellar mass with suprasellar extension most compatible with Pituitary Macroadenoma. There is susceptibility within the mass which might indicate prior hemorrhage, but homogeneous pre-contrast T1 signal argues against recent bleeding. Regional mass effect including splaying of surrounding vessels, compression of the optic chiasm. No overt cavernous sinus involvement at this time.  2. No other acute intracranial abnormality. Moderately advanced signal changes in the cerebral white matter, and solitary chronic microhemorrhage in the cerebellum, most likely due to chronic small vessel disease.   Micro Data:  10/07/20 COVId/flu neg  Antimicrobials:    Interim history/subjective:  O/N events: No acute events  Rebecca Trevino was examined and evaluated at  bedside this am. She mentions having some discomfort under her ribs but otherwise has no acute complaints. Mentions if her fluid restriction can be lifted now that her sodium has normalized. Discussed plan to transfer out of ICU today.  Objective   Blood pressure 135/83, pulse 80, temperature 98.8 F (37.1 C), temperature source Oral, resp. rate 11, height 5\' 6"  (1.676 m), weight 69.6 kg, SpO2 100 %.        Intake/Output Summary (Last 24 hours) at 10/12/2020 0702 Last data filed at 10/12/2020 0600 Gross per 24 hour  Intake 800 ml  Output 3410 ml  Net -2610 ml   Filed Weights   10/07/20 1539 10/08/20 0430 10/09/20 0500  Weight: 70.3 kg 69.9 kg 69.6 kg   Physical Exam: Gen: Well-developed, well nourished, NAD HEENT: NCAT head, hearing intact, EOMI Pulm: Breathing comfortably on room air, no cough, no distress  CV: RRR, S1s2 wnl Extm: ROM intact, No peripheral edema Skin: Dry, Warm, normal turgor.  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 Post-op day 2. Denies significant post-op pain. No complications during OR per OP note. Vision intact. - Appreciate neurosurg / ENT recs - Neurochecks  #Hyponatremia  #Secondary adrenal insufficiency Initially presented w/ appearance of secondary adrenal insufficiency. Post-op had episode of hyponatremia down to 123. Put on fluid restriction by neurosurgery. SIADH/DI labs negative with urine NA <10. Na rising with repletion via food. Na 127->129->134->138 - Can d/c q6hr lab checks, transfer out of ICU - C/w monitor - C/w prednisone 20mg  BID, start taper - C/w synthroid 65mcg  Best practice (evaluated daily)  Diet: Regular Pain/Anxiety/Delirium protocol (if indicated): Dilaudid, Norco VAP protocol (if indicated): N/A DVT prophylaxis: SCDs GI  prophylaxis: pepcid Glucose control: Mobility:  last date of multidisciplinary goals of care discussion Family and staff present  Summary of discussion  Follow up goals of care discussion due Code Status: Full Code Disposition: 4N  Mosetta Anis, MD 10/12/2020, 7:02 AM PGY-3, [place] [facility] Pager: [phone]

## 2020-10-12 NOTE — Progress Notes (Signed)
   ENT Progress Note: POD #2 s/p Procedure(s): ENDONASAL ENDOSCOPIC TRANSPHENOIDAL TUMOR RESECTION   Subjective: Stable, tol po  Objective: Vital signs in last 24 hours: Temp:  [97.7 F (36.5 C)-99.4 F (37.4 C)] 97.9 F (36.6 C) (12/15 0811) Pulse Rate:  [79-103] 79 (12/15 0700) Resp:  [11-21] 12 (12/15 0800) BP: (103-161)/(63-98) 139/80 (12/15 0800) SpO2:  [93 %-100 %] 98 % (12/15 0800) Arterial Line BP: (107-174)/(64-85) 174/85 (12/15 0800) Weight change:  Last BM Date: 10/10/20  Intake/Output from previous day: 12/14 0701 - 12/15 0700 In: 800 [P.O.:700; IV Piggyback:100] Out: 3410 [Urine:3410] Intake/Output this shift: Total I/O In: 14.7 [IV Piggyback:14.7] Out: 150 [Urine:150]  Labs: Recent Labs    10/10/20 1608 10/12/20 0501  WBC 12.4* 9.2  HGB 12.4 10.1*  HCT 33.8* 30.5*  PLT 307 272   Recent Labs    10/11/20 2203 10/12/20 0501  NA 134* 138  K 4.1 3.8  CL 104 105  CO2 23 25  GLUCOSE 138* 108*  BUN 8 6*  CALCIUM 9.2 9.1    Studies/Results: No results found.   PHYSICAL EXAM: No discharge or bleeding    Assessment/Plan: Pt stable Vision improved Cont nasal restrictions and saline spray - F/U 2-3 weeks as an OP    Jerrell Belfast 10/12/2020, 8:58 AM

## 2020-10-13 ENCOUNTER — Telehealth: Payer: Self-pay

## 2020-10-13 LAB — BASIC METABOLIC PANEL
Anion gap: 9 (ref 5–15)
BUN: 9 mg/dL (ref 8–23)
CO2: 25 mmol/L (ref 22–32)
Calcium: 9.1 mg/dL (ref 8.9–10.3)
Chloride: 104 mmol/L (ref 98–111)
Creatinine, Ser: 0.65 mg/dL (ref 0.44–1.00)
GFR, Estimated: >60 mL/min (ref >60)
Glucose, Bld: 100 mg/dL — ABNORMAL HIGH (ref 70–99)
Potassium: 3.5 mmol/L (ref 3.5–5.1)
Sodium: 138 mmol/L (ref 135–145)

## 2020-10-13 MED ORDER — LEVOTHYROXINE SODIUM 50 MCG PO TABS: 50.0000 ug | ORAL_TABLET | Freq: Every day | ORAL | 2 refills | Status: DC

## 2020-10-13 MED ORDER — HYDROCORTISONE 5 MG PO TABS: 5.0000 mg | ORAL_TABLET | Freq: Three times a day (TID) | ORAL | 2 refills | Status: DC

## 2020-10-13 NOTE — Progress Notes (Signed)
OT Cancellation Note  Patient Details Name: Rebecca Trevino MRN: [id number] DOB: 08/30/1956   Cancelled Treatment:    Reason Eval/Treat Not Completed: Patient declined, no reason specified;Other (comment) Pt requesting to speak with MD before working with OTA, will check back as time allows for OT session.   Lanier Clam., COTA/L Acute Rehabilitation Services [phone] [place] 10/13/2020, 8:20 AM

## 2020-10-13 NOTE — Progress Notes (Signed)
Discharge instructions explained and provided to patient verbalized understanding. Patient left floor via wheelchair accompanied by staff. No c/o pain   Estuardo Frisbee, Tivis Ringer, RN

## 2020-10-13 NOTE — Telephone Encounter (Signed)
T Ok, I will work on this.  Our staff is calling her.  Thanks. S

## 2020-10-13 NOTE — Progress Notes (Signed)
Pt transferred from 4NICU via wheelchair, belongings placed at bedside and pt alert and oriented x4.  Vital signs stable, pt sitting in chair, call bell within reach, no needs expressed at this time

## 2020-10-13 NOTE — Discharge Instructions (Addendum)
Discharge Instructions  No restriction in activities, slowly increase your activity back to normal.   Follow up with Dr. Zada Finders in 2 weeks after discharge. If you do not already have a discharge appointment, please call his office at [phone] to schedule a follow up appointment. If you have any concerns or questions, please call the office and let us know.  I sent you home on a small dose of steroids (hydrocortisone) and thyroid replacement (levothyroxine). There's also an order to get blood drawn as an outpatient in 1 week to check your sodium level. If there's any problem with getting this lab drawn, call my office and we'll sort it out. After discharge, you'll need to call Dr. Renato Shin from Endocrinology to make a new patient appointment with him. He will help with following your hormones going forward. His office number is [phone].  Check your blood pressure every day at home. If you're feeling well and your blood pressure is less than 160/100, don't restart your blood pressure medication (lisinopril/HCTZ) until we check your sodium again.   Sinus/Nasal Instructions:  1. Limited activity 2. Liquid and soft diet 3. May bathe and shower 4. Saline nasal spray - 4 puffs/nostril every hour while awake, begin the morning after surgery 5. Elevate Head of Bed 6. No nose blowing/Open mouth sneeze 7. Alternate Tylenol and ibuprofen every 6 hours as needed for pain.  Call [facility] for any questions or concerns: [phone]

## 2020-10-13 NOTE — Progress Notes (Signed)
Neurosurgery Service Progress Note  Subjective: No acute events overnight, thirst drive normalized  Objective: Vitals:   10/12/20 2302 10/13/20 0309 10/13/20 0500 10/13/20 0755  BP: 136/82 117/70  139/77  Pulse: 77 78  94  Resp: 15 11  20   Temp: 97.8 F (36.6 C) 97.7 F (36.5 C)  98.1 F (36.7 C)  TempSrc: Oral Oral  Oral  SpO2: 100% 99%  100%  Weight:   72.6 kg   Height:        Physical Exam: AOx3, PERRL, VFF to confrontation, EOMI, FS, TM, Strength 5/5 x4, SILTx4, no drift  Assessment & Plan: 64 y.o. woman w/ pituitary apoplexy, hyponatremia, non-functioning tumor. 12/13 s/p endoscopic endonasal TSA   -final path pending -discharge home today -SCDs/TEDs, SQH  Judith Part  10/13/20 9:19 AM

## 2020-10-13 NOTE — Telephone Encounter (Signed)
Patient is being discharged from hospital today and was told by the Neurosurgeon (Dr Zada Finders) to schedule a new patient appointment specifically with Dr Loanne Drilling.   Per patient they do not know exactly what the Dx is but it has something to do with the thyroid. Please advise.

## 2020-10-13 NOTE — Telephone Encounter (Signed)
-----   Message from Judith Part, MD sent at 10/13/2020  9:26 AM EST ----- Regarding: New patient Pituitary apoplexy with hyponatremia, was going to have her follow up with you as an outpatient if you're okay with it.   She had with 2 weeks of headaches, nausea, and vomiting. Her optho picked up bad bilateral temporal field cuts and sent her to the ED, which confirmed apoplexy. I took her to the OR, I believe I got everything out, vision is back to normal. She was mildly panhypopit at admission (on labs, not clinically) but had a sodium of 115.   I'm not sure if it was the 2 weeks of vomiting, SIADH, polydypisa, or reset osmostat but it responded to fluid restriction and her thirst drive returned to normal after a few days, never had DI, pretty weird (at least to me). I sent her home on 10/5/5 of hydrocort and 50 of levothyroxine with a repeat Na in a few days to make sure she doesn't dip back down.

## 2020-10-13 NOTE — TOC Transition Note (Signed)
Transition of Care (TOC) - CM/SW Discharge Note Marvetta Gibbons RN,BSN Transitions of Care Unit 4NP (non trauma) - RN Case Manager See Treatment Team for direct Phone #   Patient Details  Name: Rebecca Trevino MRN: [id number] Date of Birth: [birth year]  Transition of [facility]) CM/SW Contact:  Dawayne Patricia, RN Phone Number: 10/13/2020, 1:01 PM   Clinical Narrative:     Noted recs from PT for outpt PT, spoke with bedside RN who states pt will f/u with neuro surgery on post op visit regarding outpt therapy needs. No TOC needs noted for transition home at this time  Final next level of care: Home/Self Care Barriers to Discharge: No Barriers Identified   Patient Goals and CMS Choice        Discharge Placement         home              Discharge Plan and Services                                     Social Determinants of Health (SDOH) Interventions     Readmission Risk Interventions Readmission Risk Prevention Plan 10/13/2020  Post Dischage Appt Complete  Medication Screening Complete  Transportation Screening Complete  Some recent data might be hidden

## 2020-10-13 NOTE — Progress Notes (Signed)
Occupational Therapy Treatment Patient Details Name: Rebecca Trevino MRN: [id number] DOB: [birth year] Today's Date: 10/13/2020    History of present illness 64 yo woman with hx of HTN and GERD, here with  blurred vision x 2 weeks, headaches, nausea/vomiting, sent from [facility] due to bitemporal hemianopsia.  Found to have 2.9 cm round, intra-sellar mass  with suprasellar extension. Now s/p Endoscopic transsphenoidal pituitary resection with intraoperative navigation, and bil Sphenoidotomy   OT comments   Pt seated in recliner upon arrival with husband present agreeable to OT intervention. Pt reports no pain but continues to present with fatigue, generalized weakness, impaired balance and decreased activity tolerance. Overall, pt requires MIN  HHA assist for household distance functional mobility. Pt additionally able to complete x12 steps with MIN A for balance as pt reports needing to go up/down steps at home to get to her bed room. Practiced steps with railing on R and L side. Pt reports visual deficits have resolved and is eager to DC home. Pt would continue to benefit from skilled occupational therapy while admitted and after d/c to address the below listed limitations in order to improve overall functional mobility and facilitate independence with BADL participation. DC plan remains appropriate, will follow acutely per POC.      Follow Up Recommendations  Home health OT;Supervision/Assistance - 24 hour    Equipment Recommendations  3 in 1 bedside commode;Other (comment) (for use in shower)    Recommendations for Other Services      Precautions / Restrictions Precautions Precautions: Fall Precaution Comments: Fall risk mild Restrictions Weight Bearing Restrictions: No       Mobility Bed Mobility               General bed mobility comments: OOB in recliner  Transfers Overall transfer level: Needs assistance Equipment used: 1 person hand held assist Transfers: Sit to/from  Stand Sit to Stand: Min guard         General transfer comment: Min guard for safety assist    Balance Overall balance assessment: Needs assistance Sitting-balance support: Feet supported       Standing balance support: No upper extremity supported;During functional activity Standing balance-Leahy Scale: Fair                             ADL either performed or assessed with clinical judgement   ADL Overall ADL's : Needs assistance/impaired                 Upper Body Dressing : Minimal assistance;Sitting Upper Body Dressing Details (indicate cue type and reason): to don gown as back side cover   Lower Body Dressing Details (indicate cue type and reason): tp reports some increased dizziness but overall much better,education provided on firgure four method to compensate for residual dizziness Toilet Transfer: Minimal assistance;Ambulation Toilet Transfer Details (indicate cue type and reason): simulated via functional mobility, MIN A for balance.       Tub/Shower Transfer Details (indicate cue type and reason): pt reports tub shower at home, do not forsee any issues with pt stepping over threshold as pt able to complete x12 steps with railing and MINA, pts husband present and reports can assist with shower tranfser at home Functional mobility during ADLs: Minimal assistance General ADL Comments: pt completed stair training, functional mobility with MIN A. no reports of visual change or dizziness, pt feels fatigue and generalized weakness is her most limiting factor  Vision Baseline Vision/History: Wears glasses Wears Glasses: At all times Patient Visual Report: No change from baseline;Other (comment) (field cut and blurry vision have resolved)     Perception     Praxis      Cognition Arousal/Alertness: Awake/alert Behavior During Therapy: WFL for tasks assessed/performed Overall Cognitive Status: Within Functional Limits for tasks assessed                                           Exercises Other Exercises Other Exercises: pt completed stair training with OTA; x12 steps with MIN A for balance, railing on R side with ascending and rail on L side with descending. pt reports she only needs to go upstairs at night and husband will be with her   Shoulder Instructions       General Comments pts husband present during session, very helpful and supportive. discussed IADL reintegration and being mindful of tasks where dividing attention is required. pt VSS during mobility RR increase to 33 with stair training HR 98 bpm    Pertinent Vitals/ Pain       Pain Assessment: No/denies pain  Home Living                                          Prior Functioning/Environment              Frequency  Min 2X/week        Progress Toward Goals  OT Goals(current goals can now be found in the care plan section)  Progress towards OT goals: Progressing toward goals  Acute Rehab OT Goals Patient Stated Goal: return to independent OT Goal Formulation: With patient Time For Goal Achievement: 10/25/20 Potential to Achieve Goals: Good  Plan Discharge plan remains appropriate;Frequency remains appropriate    Co-evaluation                 AM-PAC OT "6 Clicks" Daily Activity     Outcome Measure   Help from another person eating meals?: None Help from another person taking care of personal grooming?: A Little Help from another person toileting, which includes using toliet, bedpan, or urinal?: A Little Help from another person bathing (including washing, rinsing, drying)?: A Little Help from another person to put on and taking off regular upper body clothing?: None Help from another person to put on and taking off regular lower body clothing?: None 6 Click Score: 21    End of Session Equipment Utilized During Treatment: Gait belt  OT Visit Diagnosis: Dizziness and giddiness (R42);Other  abnormalities of gait and mobility (R26.89);Muscle weakness (generalized) (M62.81);Other symptoms and signs involving the nervous system (R29.898)   Activity Tolerance Patient tolerated treatment well   Patient Left in chair;with call bell/phone within reach;with family/visitor present   Nurse Communication Mobility status        Time: 0932-1000 OT Time Calculation (min): 28 min  Charges: OT General Charges $OT Visit: 1 Visit OT Treatments $Self Care/Home Management : 8-22 mins $Therapeutic Activity: 8-22 mins  Lanier Clam., COTA/L Acute Rehabilitation Services [phone]   Ihor Gully 10/13/2020, 10:53 AM

## 2020-10-13 NOTE — Telephone Encounter (Signed)
OK 

## 2020-10-13 NOTE — Plan of Care (Signed)
  Problem: Clinical Measurements: Goal: Ability to maintain clinical measurements within normal limits will improve Outcome: Adequate for Discharge Goal: Will remain free from infection Outcome: Adequate for Discharge Goal: Diagnostic test results will improve Outcome: Adequate for Discharge Goal: Respiratory complications will improve Outcome: Adequate for Discharge Goal: Cardiovascular complication will be avoided Outcome: Adequate for Discharge   Problem: Activity: Goal: Risk for activity intolerance will decrease Outcome: Adequate for Discharge   Problem: Nutrition: Goal: Adequate nutrition will be maintained Outcome: Adequate for Discharge   Problem: Safety: Goal: Ability to remain free from injury will improve Outcome: Adequate for Discharge   Problem: Skin Integrity: Goal: Risk for impaired skin integrity will decrease Outcome: Adequate for Discharge

## 2020-10-13 NOTE — Discharge Summary (Signed)
Discharge Summary  Date of Admission: 10/07/2020  Date of Discharge: 10/13/20  Attending Physician: Emelda Brothers, MD  Hospital Course: Patient was admitted with signs and symptoms of pituitary apoplexy with severe hyponatremia. She was admitted to the ICU for hypertonic saline and I was consulted. After her sodium improved, she was taken to the OR for an endonasal endoscopic tumor resection. Her vision normalized post-op and she recovered well. Her post-op sodium dropped again and this normalized with fluid restriction, which was liberalized without repeat hyponatremia. She was mildly panhypopit on labwork so she was started on levothyroxine and prednisone, which was transitioned to hydrocortisone before discharge. She will follow up with myself and Dr. Wilburn Cornelia from ENT in 2-4 weeks after discharge and have a repeat lab draw to check her sodium in 1 week. Pathology was still pending at the time of discharge.   Neurologic exam at discharge:  AOx3, PERRL, EOMI, FS, TM Strength 5/5 x4, SILTx4, no drift, VFF to confrontation  Discharge diagnosis: Pituitary apoplexy  Judith Part, MD 10/13/20 9:35 AM

## 2020-10-19 DIAGNOSIS — D352 Benign neoplasm of pituitary gland: Secondary | ICD-10-CM | POA: Diagnosis not present

## 2020-11-11 DIAGNOSIS — D497 Neoplasm of unspecified behavior of endocrine glands and other parts of nervous system: Secondary | ICD-10-CM | POA: Diagnosis not present

## 2020-11-12 ENCOUNTER — Other Ambulatory Visit: Payer: Self-pay | Admitting: Neurological Surgery

## 2020-11-12 DIAGNOSIS — D352 Benign neoplasm of pituitary gland: Secondary | ICD-10-CM

## 2020-11-22 ENCOUNTER — Other Ambulatory Visit: Payer: Self-pay

## 2020-11-24 ENCOUNTER — Other Ambulatory Visit: Payer: Self-pay

## 2020-11-24 ENCOUNTER — Ambulatory Visit: Payer: BC Managed Care – PPO | Admitting: Endocrinology

## 2020-11-24 VITALS — BP 130/80 | HR 75 | Ht 66.0 in | Wt 162.0 lb

## 2020-11-24 DIAGNOSIS — E236 Other disorders of pituitary gland: Secondary | ICD-10-CM

## 2020-11-24 MED ORDER — PREDNISONE 5 MG PO TABS: 5.0000 mg | ORAL_TABLET | ORAL | 5 refills | Status: DC

## 2020-11-24 NOTE — Progress Notes (Signed)
Subjective:    Patient ID: Rebecca Trevino, female    DOB: [birth year], 65 y.o.   MRN: [id number]  HPI Pt is ref by Dr Zada Finders; pt was dx'ed in late 2021 with large pituitary adenoma.  Since surgery, lightheadedness is much better.  pt states she feels well in general, except for mild anxiety.  excessive thirst is less now.  She has nocturia 2-3 times per night. F/U MRI is sched for 12/22/20.  She takes cortef 20/d and synthroid 50/d.   Past Medical History:  Diagnosis Date  . High blood pressure     Past Surgical History:  Procedure Laterality Date  . TRANSPHENOIDAL APPROACH EXPOSURE N/A 10/10/2020   Procedure: ENDONASAL ENDOSCOPIC TRANSPHENOIDAL TUMOR RESECTION;  Surgeon: Judith Part, MD;  Location: [facility];  Service: Neurosurgery;  Laterality: N/A;    Social History   Socioeconomic History  . Marital status: Unknown    Spouse name: Not on file  . Number of children: Not on file  . Years of education: Not on file  . Highest education level: Not on file  Occupational History  . Not on file  Tobacco Use  . Smoking status: Never Smoker  . Smokeless tobacco: Never Used  Substance and Sexual Activity  . Alcohol use: Not on file  . Drug use: Not on file  . Sexual activity: Not on file  Other Topics Concern  . Not on file  Social History Narrative  . Not on file   Social Determinants of Health   Financial Resource Strain: Not on file  Food Insecurity: Not on file  Transportation Needs: Not on file  Physical Activity: Not on file  Stress: Not on file  Social Connections: Not on file  Intimate Partner Violence: Not on file    Current Outpatient Medications on File Prior to Visit  Medication Sig Dispense Refill  . albuterol (VENTOLIN HFA) 108 (90 Base) MCG/ACT inhaler Inhale 1-2 puffs into the lungs every 6 (six) hours as needed for wheezing or shortness of breath.    . Calcium Carb-Cholecalciferol 600-200 MG-UNIT TABS Take 1 tablet by mouth daily.    . hydrOXYzine  (ATARAX/VISTARIL) 25 MG tablet Take 25 mg by mouth at bedtime as needed (sleep).    Marland Kitchen levothyroxine (SYNTHROID) 50 MCG tablet Take 1 tablet (50 mcg total) by mouth daily at 6 (six) AM. 30 tablet 2  . Magnesium 500 MG TABS Take 500 mg by mouth daily.    Marland Kitchen omeprazole (PRILOSEC) 20 MG capsule Take 20 mg by mouth daily.    . ondansetron (ZOFRAN) 4 MG tablet Take 4 mg by mouth every 8 (eight) hours as needed for nausea or vomiting.    Marland Kitchen ibuprofen (ADVIL) 600 MG tablet Take 600 mg by mouth 3 (three) times daily as needed for mild pain or headache.     No current facility-administered medications on file prior to visit.    Allergies  Allergen Reactions  . Codeine Nausea And Vomiting  . Erythromycin Nausea And Vomiting    No family history on file.  BP 130/80 (BP Location: Right Arm, Patient Position: Sitting, Cuff Size: Normal)   Pulse 75   Ht 5\' 6"  (1.676 m)   Wt 162 lb (73.5 kg)   SpO2 98%   BMI 26.15 kg/m    Review of Systems Denies LOC.      Objective:   Physical Exam VITAL SIGNS:  See vs page GENERAL: no distress NECK: There is no palpable thyroid enlargement.  No  thyroid nodule is palpable.  No palpable lymphadenopathy at the anterior nec EXT: no leg edema GAIT: normal and steady   MRI (12/21): Up to 28 mm heterogeneously enhancing intrasellar mass with suprasellar extension most compatible with Pituitary Macroadenoma. There is susceptibility within the mass which might indicate prior hemorrhage, but homogeneous pre-contrast T1 signal argues against recent bleeding.  I have reviewed outside records, and summarized: Pt was noted to have elevated A1c, and referred here.  She presented in late 2021 with visual loss, and MRI was ordered.  This suggested apoplexy.  She was admitted, and underwent tumor resection.     Assessment & Plan:  Pituitary adenoma, nonsecretory, s/p resection, new to me.  She needs pit function testing.  We'll taper prednisone if possible.     Patient Instructions  We will need to take this complex situation in stages I have sent a prescription to your pharmacy, to change the hydrocortisone to prednisone. Please do blood tests next week.  You do not have to be fasting.   Please come back for a follow-up appointment in 2 weeks.

## 2020-11-24 NOTE — Patient Instructions (Addendum)
We will need to take this complex situation in stages I have sent a prescription to your pharmacy, to change the hydrocortisone to prednisone. Please do blood tests next week.  You do not have to be fasting.   Please come back for a follow-up appointment in 2 weeks.

## 2020-11-28 DIAGNOSIS — H4720 Unspecified optic atrophy: Secondary | ICD-10-CM | POA: Diagnosis not present

## 2020-11-28 DIAGNOSIS — H53123 Transient visual loss, bilateral: Secondary | ICD-10-CM | POA: Diagnosis not present

## 2020-12-02 ENCOUNTER — Other Ambulatory Visit: Payer: Self-pay

## 2020-12-02 ENCOUNTER — Other Ambulatory Visit (INDEPENDENT_AMBULATORY_CARE_PROVIDER_SITE_OTHER): Payer: BC Managed Care – PPO

## 2020-12-02 DIAGNOSIS — E236 Other disorders of pituitary gland: Secondary | ICD-10-CM | POA: Diagnosis not present

## 2020-12-02 LAB — BASIC METABOLIC PANEL
BUN: 16 mg/dL (ref 6–23)
CO2: 30 mEq/L (ref 19–32)
Calcium: 9.4 mg/dL (ref 8.4–10.5)
Chloride: 103 mEq/L (ref 96–112)
Creatinine, Ser: 0.76 mg/dL (ref 0.40–1.20)
GFR: 81.44 mL/min (ref >60.00)
Glucose, Bld: 81 mg/dL (ref 70–99)
Potassium: 4.1 mEq/L (ref 3.5–5.1)
Sodium: 138 mEq/L (ref 135–145)

## 2020-12-02 LAB — URINALYSIS, ROUTINE W REFLEX MICROSCOPIC
Bilirubin Urine: NEGATIVE
Hgb urine dipstick: NEGATIVE
Ketones, ur: NEGATIVE
Leukocytes,Ua: NEGATIVE
Nitrite: NEGATIVE
Specific Gravity, Urine: 1.01 (ref 1.000–1.030)
Total Protein, Urine: NEGATIVE
Urine Glucose: NEGATIVE
Urobilinogen, UA: 0.2 (ref 0.0–1.0)
pH: 7 (ref 5.0–8.0)

## 2020-12-02 LAB — TSH: TSH: 0.93 u[IU]/mL (ref 0.35–4.50)

## 2020-12-02 LAB — FOLLICLE STIMULATING HORMONE: FSH: 11.9 m[IU]/mL

## 2020-12-02 LAB — T4, FREE: Free T4: 0.89 ng/dL (ref 0.60–1.60)

## 2020-12-02 LAB — LUTEINIZING HORMONE: LH: 2.82 m[IU]/mL

## 2020-12-05 ENCOUNTER — Ambulatory Visit: Payer: BC Managed Care – PPO | Admitting: Endocrinology

## 2020-12-07 LAB — PROLACTIN: Prolactin: 9.2 ng/mL

## 2020-12-07 LAB — ACTH: C206 ACTH: <5 pg/mL — ABNORMAL LOW (ref 6–50)

## 2020-12-09 ENCOUNTER — Ambulatory Visit: Payer: BC Managed Care – PPO | Admitting: Endocrinology

## 2020-12-09 ENCOUNTER — Other Ambulatory Visit: Payer: Self-pay

## 2020-12-09 VITALS — BP 130/84 | HR 85 | Ht 67.0 in | Wt 162.4 lb

## 2020-12-09 DIAGNOSIS — E236 Other disorders of pituitary gland: Secondary | ICD-10-CM | POA: Diagnosis not present

## 2020-12-09 MED ORDER — LEVOTHYROXINE SODIUM 75 MCG PO TABS: 75.0000 ug | ORAL_TABLET | Freq: Every day | ORAL | 3 refills | Status: DC

## 2020-12-09 NOTE — Progress Notes (Signed)
Subjective:    Patient ID: Rebecca Trevino, female    DOB: Mar 12, 1956, 65 y.o.   MRN: [id number]  HPI Pt returns for f/u of pituitar insuff (she had resect of large pituitary adenoma in 2021, with poss apoplexy). F/U MRI is sched for 12/22/20 He has these pituitary functions: ACTH: needs and takes prednisone Prolactin: normal FSH/LH: low TSH: needs and takes synthroid VP: normal labs Interval [id number] is down to twice per night.  pt states she feels well in general, except for lightheadedness.  Headache is mild.   Past Medical History:  Diagnosis Date  . High blood pressure     Past Surgical History:  Procedure Laterality Date  . TRANSPHENOIDAL APPROACH EXPOSURE N/A 10/10/2020   Procedure: ENDONASAL ENDOSCOPIC TRANSPHENOIDAL TUMOR RESECTION;  Surgeon: Judith Part, MD;  Location: [facility];  Service: Neurosurgery;  Laterality: N/A;    Social History   Socioeconomic History  . Marital status: Unknown    Spouse name: Not on file  . Number of children: Not on file  . Years of education: Not on file  . Highest education level: Not on file  Occupational History  . Not on file  Tobacco Use  . Smoking status: Never Smoker  . Smokeless tobacco: Never Used  Substance and Sexual Activity  . Alcohol use: Not on file  . Drug use: Not on file  . Sexual activity: Not on file  Other Topics Concern  . Not on file  Social History Narrative  . Not on file   Social Determinants of Health   Financial Resource Strain: Not on file  Food Insecurity: Not on file  Transportation Needs: Not on file  Physical Activity: Not on file  Stress: Not on file  Social Connections: Not on file  Intimate Partner Violence: Not on file    Current Outpatient Medications on File Prior to Visit  Medication Sig Dispense Refill  . albuterol (VENTOLIN HFA) 108 (90 Base) MCG/ACT inhaler Inhale 1-2 puffs into the lungs every 6 (six) hours as needed for wheezing or shortness of breath.    . Calcium  Carb-Cholecalciferol 600-200 MG-UNIT TABS Take 1 tablet by mouth daily.    . hydrOXYzine (ATARAX/VISTARIL) 25 MG tablet Take 25 mg by mouth at bedtime as needed (sleep).    . Magnesium 500 MG TABS Take 500 mg by mouth daily.    Marland Kitchen omeprazole (PRILOSEC) 20 MG capsule Take 20 mg by mouth daily.    . ondansetron (ZOFRAN) 4 MG tablet Take 4 mg by mouth every 8 (eight) hours as needed for nausea or vomiting.    . predniSONE (DELTASONE) 5 MG tablet Take 1 tablet (5 mg total) by mouth every morning. 30 tablet 5  . ibuprofen (ADVIL) 600 MG tablet Take 600 mg by mouth 3 (three) times daily as needed for mild pain or headache.     No current facility-administered medications on file prior to visit.    Allergies  Allergen Reactions  . Codeine Nausea And Vomiting  . Erythromycin Nausea And Vomiting    No family history on file.  BP 130/84 (BP Location: Right Arm, Patient Position: Sitting, Cuff Size: Normal)   Pulse 85   Ht 5\' 7"  (1.702 m)   Wt 162 lb 6.4 oz (73.7 kg)   SpO2 98%   BMI 25.44 kg/m     Review of Systems Denies n/v    Objective:   Physical Exam VITAL SIGNS:  See vs page GENERAL: no distress Ext: no leg edema.  Lab Results  Component Value Date   TSH 0.93 12/02/2020       Assessment & Plan:  Central hypothyroidism: uncontrolled HPA insuff: well-replaced  Patient Instructions  Please continue the same prednisone.   I have sent a prescription to your pharmacy, to increase the levothyroxine. Please come back for a follow-up appointment in 4-6 weeks.

## 2020-12-09 NOTE — Patient Instructions (Addendum)
Please continue the same prednisone.   I have sent a prescription to your pharmacy, to increase the levothyroxine. Please come back for a follow-up appointment in 4-6 weeks.

## 2020-12-11 LAB — ALPHA SUBUNIT (FREE): Alpha Subunit (Free): 0.31 ng/mL

## 2020-12-22 ENCOUNTER — Ambulatory Visit
Admission: RE | Admit: 2020-12-22 | Discharge: 2020-12-22 | Disposition: A | Discharge status: Home / Self Care | Payer: BC Managed Care – PPO | Source: Ambulatory Visit | Attending: Neurological Surgery | Admitting: Neurological Surgery

## 2020-12-22 DIAGNOSIS — D352 Benign neoplasm of pituitary gland: Secondary | ICD-10-CM

## 2020-12-22 DIAGNOSIS — Z86018 Personal history of other benign neoplasm: Secondary | ICD-10-CM | POA: Diagnosis not present

## 2020-12-22 MED ORDER — GADOBENATE DIMEGLUMINE 529 MG/ML IV SOLN
8.0000 mL | Freq: Once | INTRAVENOUS | Status: AC | PRN
  Administered 2020-12-22: 8 mL via INTRAVENOUS

## 2021-01-05 ENCOUNTER — Other Ambulatory Visit: Payer: Self-pay

## 2021-01-06 ENCOUNTER — Ambulatory Visit: Payer: BC Managed Care – PPO

## 2021-01-09 ENCOUNTER — Encounter: Payer: Self-pay | Admitting: Endocrinology

## 2021-01-09 ENCOUNTER — Ambulatory Visit: Payer: BC Managed Care – PPO | Admitting: Endocrinology

## 2021-01-17 ENCOUNTER — Other Ambulatory Visit: Payer: Self-pay

## 2021-01-17 ENCOUNTER — Ambulatory Visit: Payer: BC Managed Care – PPO | Admitting: Internal Medicine

## 2021-01-17 ENCOUNTER — Encounter: Payer: Self-pay | Admitting: Internal Medicine

## 2021-01-17 VITALS — BP 124/60 | HR 87 | Temp 98.0°F | Resp 18 | Ht 66.5 in | Wt 163.4 lb

## 2021-01-17 DIAGNOSIS — Z0000 Encounter for general adult medical examination without abnormal findings: Secondary | ICD-10-CM | POA: Diagnosis not present

## 2021-01-17 DIAGNOSIS — Z23 Encounter for immunization: Secondary | ICD-10-CM

## 2021-01-17 DIAGNOSIS — E236 Other disorders of pituitary gland: Secondary | ICD-10-CM

## 2021-01-17 DIAGNOSIS — Z1211 Encounter for screening for malignant neoplasm of colon: Secondary | ICD-10-CM

## 2021-01-17 DIAGNOSIS — Z13220 Encounter for screening for lipoid disorders: Secondary | ICD-10-CM

## 2021-01-17 DIAGNOSIS — Z1231 Encounter for screening mammogram for malignant neoplasm of breast: Secondary | ICD-10-CM

## 2021-01-17 DIAGNOSIS — G9389 Other specified disorders of brain: Secondary | ICD-10-CM

## 2021-01-17 LAB — LIPID PANEL
Cholesterol: 251 mg/dL — ABNORMAL HIGH (ref 0–200)
HDL: 64.7 mg/dL (ref >39.00)
NonHDL: 186.45
Total CHOL/HDL Ratio: 4
Triglycerides: 362 mg/dL — ABNORMAL HIGH (ref 0.0–149.0)
VLDL: 72.4 mg/dL — ABNORMAL HIGH (ref 0.0–40.0)

## 2021-01-17 LAB — LDL CHOLESTEROL, DIRECT: Direct LDL: 132 mg/dL

## 2021-01-17 NOTE — Progress Notes (Signed)
   Subjective:   Patient ID: Rebecca Trevino, female    DOB: [birth year], 65 y.o.   MRN: [id number]  HPI The patient is a new 65 YO female coming in for ongoing care of her pituitary insufficiency (seeing endocrinology, taking thyroid and prednisone, levels recently checked, overall satisfied with control, still feeling some fatigued), and recent surgery for brain lesion (resection of pituitary adenoma with resultant pituitary apoplexy, she is recovering well from surgery, still mild dizziness at times, will have surveillance monitoring with MRI through neurosurgery).  Also requesting physical.   PMH, [place], social history reviewed and updated   Review of Systems  Constitutional: Negative.   HENT: Negative.   Eyes: Negative.   Respiratory: Negative for cough, chest tightness and shortness of breath.   Cardiovascular: Negative for chest pain, palpitations and leg swelling.  Gastrointestinal: Negative for abdominal distention, abdominal pain, constipation, diarrhea, nausea and vomiting.  Musculoskeletal: Negative.   Skin: Negative.   Neurological: Positive for dizziness.  Psychiatric/Behavioral: Negative.     Objective:  Physical Exam Constitutional:      Appearance: She is well-developed.  HENT:     Head: Normocephalic and atraumatic.  Cardiovascular:     Rate and Rhythm: Normal rate and regular rhythm.  Pulmonary:     Effort: Pulmonary effort is normal. No respiratory distress.     Breath sounds: Normal breath sounds. No wheezing or rales.  Abdominal:     General: Bowel sounds are normal. There is no distension.     Palpations: Abdomen is soft.     Tenderness: There is no abdominal tenderness. There is no rebound.  Musculoskeletal:     Cervical back: Normal range of motion.  Skin:    General: Skin is warm and dry.  Neurological:     Mental Status: She is alert and oriented to person, place, and time.     Coordination: Coordination normal.     Vitals:   01/17/21 1414  BP:  124/60  Pulse: 87  Resp: 18  Temp: 98 F (36.7 C)  TempSrc: Oral  SpO2: 99%  Weight: 163 lb 6.4 oz (74.1 kg)  Height: 5' 6.5" (1.689 m)    This visit occurred during the SARS-CoV-2 public health emergency.  Safety protocols were in place, including screening questions prior to the visit, additional usage of staff PPE, and extensive cleaning of exam room while observing appropriate contact time as indicated for disinfecting solutions.   Assessment & Plan:  Shingrix IM given at visit

## 2021-01-17 NOTE — Patient Instructions (Addendum)
We will check the labs today and have given you the shingles vaccine. Come back in 2 months for the second one.  We are doing the cologuard and mammogram for screening.  Health Maintenance, Female Adopting a healthy lifestyle and getting preventive care are important in promoting health and wellness. Ask your health care provider about:  The right schedule for you to have regular tests and exams.  Things you can do on your own to prevent diseases and keep yourself healthy. What should I know about diet, weight, and exercise? Eat a healthy diet  Eat a diet that includes plenty of vegetables, fruits, low-fat dairy products, and lean protein.  Do not eat a lot of foods that are high in solid fats, added sugars, or sodium.   Maintain a healthy weight Body mass index (BMI) is used to identify weight problems. It estimates body fat based on height and weight. Your health care provider can help determine your BMI and help you achieve or maintain a healthy weight. Get regular exercise Get regular exercise. This is one of the most important things you can do for your health. Most adults should:  Exercise for at least 150 minutes each week. The exercise should increase your heart rate and make you sweat (moderate-intensity exercise).  Do strengthening exercises at least twice a week. This is in addition to the moderate-intensity exercise.  Spend less time sitting. Even light physical activity can be beneficial. Watch cholesterol and blood lipids Have your blood tested for lipids and cholesterol at 67 years of age, then have this test every 5 years. Have your cholesterol levels checked more often if:  Your lipid or cholesterol levels are high.  You are older than 67 years of age.  You are at high risk for heart disease. What should I know about cancer screening? Depending on your health history and family history, you may need to have cancer screening at various ages. This may include  screening for:  Breast cancer.  Cervical cancer.  Colorectal cancer.  Skin cancer.  Lung cancer. What should I know about heart disease, diabetes, and high blood pressure? Blood pressure and heart disease  High blood pressure causes heart disease and increases the risk of stroke. This is more likely to develop in people who have high blood pressure readings, are of African descent, or are overweight.  Have your blood pressure checked: ? Every 3-5 years if you are 93-33 years of age. ? Every year if you are 80 years old or older. Diabetes Have regular diabetes screenings. This checks your fasting blood sugar level. Have the screening done:  Once every three years after age 48 if you are at a normal weight and have a low risk for diabetes.  More often and at a younger age if you are overweight or have a high risk for diabetes. What should I know about preventing infection? Hepatitis B If you have a higher risk for hepatitis B, you should be screened for this virus. Talk with your health care provider to find out if you are at risk for hepatitis B infection. Hepatitis C Testing is recommended for:  Everyone born from [birth year] through 1967.  Anyone with known risk factors for hepatitis C. Sexually transmitted infections (STIs)  Get screened for STIs, including gonorrhea and chlamydia, if: ? You are sexually active and are younger than 67 years of age. ? You are older than 67 years of age and your health care provider tells you that you are  at risk for this type of infection. ? Your sexual activity has changed since you were last screened, and you are at increased risk for chlamydia or gonorrhea. Ask your health care provider if you are at risk.  Ask your health care provider about whether you are at high risk for HIV. Your health care provider may recommend a prescription medicine to help prevent HIV infection. If you choose to take medicine to prevent HIV, you should first get  tested for HIV. You should then be tested every 3 months for as long as you are taking the medicine. Pregnancy  If you are about to stop having your period (premenopausal) and you may become pregnant, seek counseling before you get pregnant.  Take 400 to 800 micrograms (mcg) of folic acid every day if you become pregnant.  Ask for birth control (contraception) if you want to prevent pregnancy. Osteoporosis and menopause Osteoporosis is a disease in which the bones lose minerals and strength with aging. This can result in bone fractures. If you are 88 years old or older, or if you are at risk for osteoporosis and fractures, ask your health care provider if you should:  Be screened for bone loss.  Take a calcium or vitamin D supplement to lower your risk of fractures.  Be given hormone replacement therapy (HRT) to treat symptoms of menopause. Follow these instructions at home: Lifestyle  Do not use any products that contain nicotine or tobacco, such as cigarettes, e-cigarettes, and chewing tobacco. If you need help quitting, ask your health care provider.  Do not use street drugs.  Do not share needles.  Ask your health care provider for help if you need support or information about quitting drugs. Alcohol use  Do not drink alcohol if: ? Your health care provider tells you not to drink. ? You are pregnant, may be pregnant, or are planning to become pregnant.  If you drink alcohol: ? Limit how much you use to 0-1 drink a day. ? Limit intake if you are breastfeeding.  Be aware of how much alcohol is in your drink. In the [place] one drink equals one 12 oz bottle of beer (355 mL), one 5 oz glass of wine (148 mL), or one 1 oz glass of hard liquor (44 mL). General instructions  Schedule regular health, dental, and eye exams.  Stay current with your vaccines.  Tell your health care provider if: ? You often feel depressed. ? You have ever been abused or do not feel safe at  home. Summary  Adopting a healthy lifestyle and getting preventive care are important in promoting health and wellness.  Follow your health care provider's instructions about healthy diet, exercising, and getting tested or screened for diseases.  Follow your health care provider's instructions on monitoring your cholesterol and blood pressure. This information is not intended to replace advice given to you by your health care provider. Make sure you discuss any questions you have with your health care provider. Document Revised: 10/08/2018 Document Reviewed: 10/08/2018 Elsevier Patient Education  2021 [employer].

## 2021-01-20 ENCOUNTER — Other Ambulatory Visit: Payer: Self-pay | Admitting: Internal Medicine

## 2021-01-20 DIAGNOSIS — E781 Pure hyperglyceridemia: Secondary | ICD-10-CM

## 2021-01-20 DIAGNOSIS — Z0000 Encounter for general adult medical examination without abnormal findings: Secondary | ICD-10-CM | POA: Insufficient documentation

## 2021-01-20 NOTE — Assessment & Plan Note (Signed)
Seeing endo for management and dosing. It is unclear if her pituitary insufficiency will be lifelong or if this is traumatic after surgery and may resolve.

## 2021-01-20 NOTE — Assessment & Plan Note (Signed)
Pathology in our system indicates pituitary adenoma. She will get monitoring MRI with neurosurgery.

## 2021-01-20 NOTE — Assessment & Plan Note (Signed)
Flu shot up to date. Covid-19 up to date including booster. Pneumonia she is unsure we are requesting records. Shingrix given 1st today. Tetanus unsure if up to date getting records. Cologuard ordered today. Mammogram ordered today, pap smear aged out and dexa unsure if up to date getting records. Counseled about sun safety and mole surveillance. Counseled about the dangers of distracted driving. Given 10 year screening recommendations.

## 2021-02-01 ENCOUNTER — Other Ambulatory Visit (INDEPENDENT_AMBULATORY_CARE_PROVIDER_SITE_OTHER): Payer: BC Managed Care – PPO

## 2021-02-01 ENCOUNTER — Other Ambulatory Visit: Payer: Self-pay

## 2021-02-01 ENCOUNTER — Ambulatory Visit: Payer: BC Managed Care – PPO | Admitting: Endocrinology

## 2021-02-01 DIAGNOSIS — R609 Edema, unspecified: Secondary | ICD-10-CM | POA: Insufficient documentation

## 2021-02-01 DIAGNOSIS — E781 Pure hyperglyceridemia: Secondary | ICD-10-CM | POA: Diagnosis not present

## 2021-02-01 DIAGNOSIS — E230 Hypopituitarism: Secondary | ICD-10-CM | POA: Insufficient documentation

## 2021-02-01 LAB — LIPID PANEL
Cholesterol: 212 mg/dL — ABNORMAL HIGH (ref 0–200)
HDL: 68.5 mg/dL (ref >39.00)
LDL Cholesterol: 119 mg/dL — ABNORMAL HIGH (ref 0–99)
NonHDL: 143.65
Total CHOL/HDL Ratio: 3
Triglycerides: 123 mg/dL (ref 0.0–149.0)
VLDL: 24.6 mg/dL (ref 0.0–40.0)

## 2021-02-01 LAB — BASIC METABOLIC PANEL
BUN: 15 mg/dL (ref 6–23)
CO2: 29 mEq/L (ref 19–32)
Calcium: 9.4 mg/dL (ref 8.4–10.5)
Chloride: 105 mEq/L (ref 96–112)
Creatinine, Ser: 0.76 mg/dL (ref 0.40–1.20)
GFR: 81.35 mL/min (ref >60.00)
Glucose, Bld: 77 mg/dL (ref 70–99)
Potassium: 3.8 mEq/L (ref 3.5–5.1)
Sodium: 140 mEq/L (ref 135–145)

## 2021-02-01 LAB — TSH: TSH: 0.06 u[IU]/mL — ABNORMAL LOW (ref 0.35–4.50)

## 2021-02-01 LAB — BRAIN NATRIURETIC PEPTIDE: Pro B Natriuretic peptide (BNP): 39 pg/mL (ref 0.0–100.0)

## 2021-02-01 LAB — T4, FREE: Free T4: 1.41 ng/dL (ref 0.60–1.60)

## 2021-02-01 NOTE — Patient Instructions (Addendum)
Please continue the same prednisone.   Blood tests are requested for you today.  We'll let you know about the results.  Please come back for a follow-up appointment in 3 months.

## 2021-02-01 NOTE — Progress Notes (Signed)
Subjective:    Patient ID: Rebecca Trevino, female    DOB: 10-Oct-1956, 65 y.o.   MRN: [id number]  HPI Pt returns for f/u of pituitar insuff (she had resect of large pituitary adenoma in 2021, with poss apoplexy).  He has these pituitary functions: ACTH: needs and takes prednisone Prolactin: normal FSH/LH: low TSH: needs and takes synthroid VP: normal labs Interval hx: Nocturia is minimal.  Since synthroid was increased, fatigue and headache are less.   Past Medical History:  Diagnosis Date  . High blood pressure     Past Surgical History:  Procedure Laterality Date  . TRANSPHENOIDAL APPROACH EXPOSURE N/A 10/10/2020   Procedure: ENDONASAL ENDOSCOPIC TRANSPHENOIDAL TUMOR RESECTION;  Surgeon: Judith Part, MD;  Location: [facility];  Service: Neurosurgery;  Laterality: N/A;    Social History   Socioeconomic History  . Marital status: Married    Spouse name: Not on file  . Number of children: Not on file  . Years of education: Not on file  . Highest education level: Not on file  Occupational History  . Not on file  Tobacco Use  . Smoking status: Never Smoker  . Smokeless tobacco: Never Used  Substance and Sexual Activity  . Alcohol use: Not on file  . Drug use: Not on file  . Sexual activity: Not on file  Other Topics Concern  . Not on file  Social History Narrative  . Not on file   Social Determinants of Health   Financial Resource Strain: Not on file  Food Insecurity: Not on file  Transportation Needs: Not on file  Physical Activity: Not on file  Stress: Not on file  Social Connections: Not on file  Intimate Partner Violence: Not on file    Current Outpatient Medications on File Prior to Visit  Medication Sig Dispense Refill  . albuterol (VENTOLIN HFA) 108 (90 Base) MCG/ACT inhaler Inhale 1-2 puffs into the lungs as needed for wheezing or shortness of breath.    . Calcium Carb-Cholecalciferol 600-200 MG-UNIT TABS Take 1 tablet by mouth daily.    .  hydrOXYzine (ATARAX/VISTARIL) 25 MG tablet Take 25 mg by mouth at bedtime as needed (sleep).    Marland Kitchen ibuprofen (ADVIL) 600 MG tablet Take 600 mg by mouth 3 (three) times daily as needed for mild pain or headache.    . levothyroxine (SYNTHROID) 75 MCG tablet Take 1 tablet (75 mcg total) by mouth daily. 90 tablet 3  . Magnesium 500 MG TABS Take 500 mg by mouth daily.    . Multiple Vitamins-Minerals (MULTIVITAMIN WITH MINERALS) tablet Take 1 tablet by mouth daily.    Marland Kitchen omeprazole (PRILOSEC) 20 MG capsule Take 20 mg by mouth daily.    . ondansetron (ZOFRAN) 4 MG tablet Take 4 mg by mouth as needed for nausea or vomiting.    . predniSONE (DELTASONE) 5 MG tablet Take 1 tablet (5 mg total) by mouth every morning. 30 tablet 5   No current facility-administered medications on file prior to visit.    Allergies  Allergen Reactions  . Codeine Nausea And Vomiting  . Erythromycin Nausea And Vomiting    No family history on file.  BP 130/82 (BP Location: Right Arm, Patient Position: Sitting, Cuff Size: Normal)   Pulse 84   Ht 5' 6.5" (1.689 m)   Wt 166 lb 6.4 oz (75.5 kg)   SpO2 94%   BMI 26.46 kg/m    Review of Systems     Objective:   Physical Exam VITAL  SIGNS:  See vs page GENERAL: no distress EXT: trace bilat leg edema GAIT: steady with assistance    MRI: Gross total resection of pituitary macro adenoma. No residual tumor.  Sella is enlarged. Probable small amount of pituitary is present to the right of midline.    Assessment & Plan:  HPA insuff: clinically well-controlled.  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   Patient Instructions  Please continue the same prednisone.   Blood tests are requested for you today.  We'll let you know about the results.  Please come back for a follow-up appointment in 3 months.

## 2021-02-07 ENCOUNTER — Other Ambulatory Visit: Payer: Self-pay

## 2021-02-07 ENCOUNTER — Ambulatory Visit: Payer: BC Managed Care – PPO | Attending: Neurological Surgery

## 2021-02-07 DIAGNOSIS — R2681 Unsteadiness on feet: Secondary | ICD-10-CM | POA: Insufficient documentation

## 2021-02-07 DIAGNOSIS — R42 Dizziness and giddiness: Secondary | ICD-10-CM | POA: Insufficient documentation

## 2021-02-07 DIAGNOSIS — H8112 Benign paroxysmal vertigo, left ear: Secondary | ICD-10-CM

## 2021-02-07 NOTE — Therapy (Signed)
[facility] [address] [place], [place], [address] Phone: [phone]   Fax:  [phone]  Physical Therapy Evaluation  Patient Details  Name: Rebecca Trevino MRN: [id number] Date of Birth: 1956-09-09 Referring Provider (PT): Emelda Brothers, MD   Encounter Date: 02/07/2021   PT End of Session - 02/07/21 1452    Visit Number 1    Number of Visits 6    Date for PT Re-Evaluation 04/08/21   POC for 6 weeks, cert for 60 days   Authorization Type BCBS (VL: 30)    PT Start Time 1452    PT Stop Time 1535    PT Time Calculation (min) 43 min    Activity Tolerance Patient tolerated treatment well    Behavior During Therapy [facility] for tasks assessed/performed           Past Medical History:  Diagnosis Date  . High blood pressure     Past Surgical History:  Procedure Laterality Date  . TRANSPHENOIDAL APPROACH EXPOSURE N/A 10/10/2020   Procedure: ENDONASAL ENDOSCOPIC TRANSPHENOIDAL TUMOR RESECTION;  Surgeon: Judith Part, MD;  Location: [facility];  Service: Neurosurgery;  Laterality: N/A;    There were no vitals filed for this visit.    Subjective Assessment - 02/07/21 1456    Subjective Patient reports that she began to have dizziness after surgery (Dec 2021). Reports more dizziness with head turns and more specifically to the L > R. Patient reports that she does having some spinning with head movements. Patient also reports intermittent lightheadedness. Patient reports she is currently avoiding looking up/down, and rolling to the L side. No falls to report. Patient did have bitemportal hemianopsia after the surgery, but this has improved significantly. Patient reports does have intermittent headaches.    Patient is accompained by: Family member   Husband   Pertinent History HTN, GERD, Pituitary Adenoma with Apoplexy    Limitations House hold activities    Patient Stated Goals Alleviate the Dizziness    Currently in  Pain? No/denies              [facility] PT Assessment - 02/07/21 0001      Assessment   Medical Diagnosis BPPV    Referring Provider (PT) Emelda Brothers, MD    Onset Date/Surgical Date 12/29/20    Prior Therapy None      Precautions   Precautions None      Balance Screen   Has the patient fallen in the past 6 months No    Has the patient had a decrease in activity level because of a fear of falling?  No    Is the patient reluctant to leave their home because of a fear of falling?  No      Home Environment   Living Environment Private residence    Living Arrangements Spouse/significant other    Available Help at Discharge Family    Type of [place] Access Level entry    [place] Two level    Alternate Level Stairs-Number of Steps 16    Alternate Level Stairs-Rails Right;Left   one rail partial way up stairs   Home Equipment None    Additional Comments patient reports more cautious with stairs, always has some UE support      Prior Function   Level of Independence Independent    Vocation Full time employment    Vocation Requirements [employer].      Cognition  Overall Cognitive Status Within Functional Limits for tasks assessed      Observation/Other Assessments   Focus on Therapeutic Outcomes (FOTO)  DFS: 46, DPS: 50.6      Sensation   Light Touch Appears Intact      ROM / Strength   AROM / PROM / Strength Strength      Strength   Overall Strength Within functional limits for tasks performed      Transfers   Transfers Sit to Stand;Stand to Sit    Sit to Stand 7: Independent    Stand to Sit 7: Independent      Ambulation/Gait   Ambulation/Gait Yes    Ambulation/Gait Assistance 7: Independent    Assistive device None    Gait Pattern Within Functional Limits    Ambulation Surface Level;Indoor                  Vestibular Assessment - 02/07/21 0001      Symptom Behavior   Subjective history of current problem  Started after surgery in Dec 2021. Denies changes in hearing. Vision changes have improved since surgery.    Type of Dizziness  Spinning;Imbalance    Frequency of Dizziness Daily    Duration of Dizziness minutes    Symptom Nature Motion provoked;Positional;Intermittent    Aggravating Factors Looking up to the ceiling;Forward bending;Rolling to left;Lying supine;Turning head quickly;Turning body quickly    Relieving Factors Slow movements;Rest    Progression of Symptoms Better      Oculomotor Exam   Oculomotor Alignment Normal    Ocular ROM WNL    Spontaneous Absent    Gaze-induced  Absent    Smooth Pursuits Intact    Saccades Intact      Oculomotor Exam-Fixation Suppressed    Left Head Impulse Normal    Right Head Impulse Normal      Vestibulo-Ocular Reflex   VOR 1 Head Only (x 1 viewing) Mild Dizziness. Overall able to maintain gaze.      Other Tests   Comments VBI Screen: Negative      Positional Testing   Dix-Hallpike Dix-Hallpike Right;Dix-Hallpike Left      Dix-Hallpike Right   Dix-Hallpike Right Duration 0    Dix-Hallpike Right Symptoms No nystagmus      Dix-Hallpike Left   Dix-Hallpike Left Duration 10 seconds    Dix-Hallpike Left Symptoms Upbeat, left rotatory nystagmus   severe symptoms             Objective measurements completed on examination: See above findings.        Vestibular Treatment/Exercise - 02/07/21 0001      Vestibular Treatment/Exercise   Vestibular Treatment Provided Canalith Repositioning    Canalith Repositioning Epley Manuever Left       EPLEY MANUEVER LEFT   Number of Reps  2    Overall Response  Improved Symptoms     RESPONSE DETAILS LEFT mild symptoms on last treatment                 PT Education - 02/07/21 1539    Education Details Educated on [employer], BPPV    Person(s) Educated Patient;Spouse    Methods Explanation    Comprehension Verbalized understanding            PT Short Term Goals  - 02/07/21 1539      PT SHORT TERM GOAL #1   Title = LTGs             PT Long Term  Goals - 02/07/21 1547      PT LONG TERM GOAL #1   Title Patient will be independent with vestibular/balance HEP (All LTGs Due: 03/14/21)    Baseline no HEP established    Time 5    Period Weeks    Status New    Target Date 03/14/21      PT LONG TERM GOAL #2   Title Patient will report no dizziness with bed mobility and demonstrate negative positional testing to indicate resolution of BPPV    Baseline L BPPV    Time 5    Period Weeks    Status New    Target Date 03/14/21      PT LONG TERM GOAL #3   Title Patient will improve DPS > 55    Baseline 46    Time 5    Period Weeks    Status New                  Plan - 02/07/21 1556    Clinical Impression Statement Patient is a 65 y.o. female that was referred to Neuro OPPT services for BPPV. Patient's PMH significant for the following: HTN, GERD, Pituitary Adenoma with Apoplexy. Patient having vertigo that began approx 4 months ago. Upon assesment patient presents with L Upbeating Nystagmus of Short Duration indicating L Posterior Canal Canalithiasis. Patient demonstrating severe vertigo, and mild nauseous sensation. Completed CRM x 2 reps, with improved symptoms noted, however mild nystagmus still present upon reassesment. Patient will benefit from skilled PT services to address impairments, and improve tolerance for functional activities.    Personal Factors and Comorbidities Comorbidity 3+    Comorbidities HTN, GERD, Pituitary Adenoma with Apoplexy    Examination-Activity Limitations Bed Mobility;Reach Overhead;Bend;Stairs;Locomotion Level    Examination-Participation Restrictions Occupation;Community Activity;Driving    Stability/Clinical Decision Making Stable/Uncomplicated    Clinical Decision Making Low    Rehab Potential Good    PT Frequency 1x / week    PT Duration --   5 weeks   PT Treatment/Interventions ADLs/Self Care Home  Management;Cryotherapy;Moist Heat;Canalith Repostioning;Gait training;Stair training;Functional mobility training;Therapeutic activities;Balance training;Neuromuscular re-education;Therapeutic exercise;Patient/family education;Manual techniques;Vestibular;Passive range of motion    PT Next Visit Plan Reassess L BPPV and treat as indicated. Assess Horizontal Canal. Assess DVA and M-CTSIB. Initiate HEP.    Consulted and Agree with Plan of Care Patient;Family member/caregiver    Family Member Consulted Husband           Patient will benefit from skilled therapeutic intervention in order to improve the following deficits and impairments:  Decreased activity tolerance,Decreased balance,Dizziness  Visit Diagnosis: BPPV (benign paroxysmal positional vertigo), left  Dizziness and giddiness  Unsteadiness on feet     Problem List Patient Active Problem List   Diagnosis Date Noted  . Pituitary insufficiency ([place]) 02/01/2021  . Edema 02/01/2021  . Routine general medical examination at a health care facility 01/20/2021  . 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 Brain mass 10/07/2020    Jones Bales, PT, DPT 02/07/2021, 4:02 PM  [facility] [address] [place], [place], [address] Phone: [phone]   Fax:  [phone]  Name: Nykerria Macconnell MRN: [id number] Date of Birth: [birth year]

## 2021-02-08 ENCOUNTER — Encounter: Payer: Self-pay | Admitting: Internal Medicine

## 2021-02-08 ENCOUNTER — Encounter: Payer: Self-pay | Admitting: Endocrinology

## 2021-02-10 ENCOUNTER — Other Ambulatory Visit: Payer: Self-pay | Admitting: Endocrinology

## 2021-02-10 MED ORDER — PREDNISONE 5 MG PO TABS: 5.0000 mg | ORAL_TABLET | ORAL | 5 refills | Status: DC

## 2021-02-10 MED ORDER — LEVOTHYROXINE SODIUM 75 MCG PO TABS: 75.0000 ug | ORAL_TABLET | Freq: Every day | ORAL | 3 refills | Status: DC

## 2021-02-15 ENCOUNTER — Telehealth: Payer: Self-pay | Admitting: Internal Medicine

## 2021-02-15 MED ORDER — ALBUTEROL SULFATE HFA 108 (90 BASE) MCG/ACT IN AERS: 1.0000 | INHALATION_SPRAY | RESPIRATORY_TRACT | 0 refills | Status: DC | PRN

## 2021-02-15 MED ORDER — ONDANSETRON HCL 4 MG PO TABS: 4.0000 mg | ORAL_TABLET | ORAL | 1 refills | Status: AC | PRN

## 2021-02-15 MED ORDER — OMEPRAZOLE 20 MG PO CPDR: 20.0000 mg | DELAYED_RELEASE_CAPSULE | Freq: Every day | ORAL | 3 refills | Status: DC

## 2021-02-15 MED ORDER — HYDROXYZINE HCL 25 MG PO TABS: 25.0000 mg | ORAL_TABLET | Freq: Every evening | ORAL | 1 refills | Status: DC | PRN

## 2021-02-15 NOTE — Telephone Encounter (Signed)
Ok to refill. Original scripts weren't prescribed by you. Please advise

## 2021-02-15 NOTE — Telephone Encounter (Signed)
Sent in

## 2021-02-15 NOTE — Telephone Encounter (Signed)
omeprazole (PRILOSEC) 20 MG capsule albuterol (VENTOLIN HFA) 108 (90 Base) MCG/ACT inhaler hydrOXYzine (ATARAX/VISTARIL) 25 MG tablet ondansetron (ZOFRAN) 4 MG tablet [facility] [place], [place] [address], [address] Phone:  [phone]  Fax:  [phone]     Last seen- 03.22.22 Next apt- n/a

## 2021-02-21 ENCOUNTER — Ambulatory Visit: Payer: BC Managed Care – PPO

## 2021-02-21 ENCOUNTER — Encounter: Payer: Self-pay | Admitting: Internal Medicine

## 2021-02-21 ENCOUNTER — Other Ambulatory Visit: Payer: Self-pay

## 2021-02-21 ENCOUNTER — Telehealth (INDEPENDENT_AMBULATORY_CARE_PROVIDER_SITE_OTHER): Payer: Medicare Other | Admitting: Internal Medicine

## 2021-02-21 DIAGNOSIS — H8112 Benign paroxysmal vertigo, left ear: Secondary | ICD-10-CM | POA: Diagnosis not present

## 2021-02-21 DIAGNOSIS — J019 Acute sinusitis, unspecified: Secondary | ICD-10-CM | POA: Insufficient documentation

## 2021-02-21 DIAGNOSIS — J0110 Acute frontal sinusitis, unspecified: Secondary | ICD-10-CM | POA: Diagnosis not present

## 2021-02-21 DIAGNOSIS — R2681 Unsteadiness on feet: Secondary | ICD-10-CM

## 2021-02-21 DIAGNOSIS — R42 Dizziness and giddiness: Secondary | ICD-10-CM

## 2021-02-21 MED ORDER — AMOXICILLIN-POT CLAVULANATE 875-125 MG PO TABS
1.0000 | ORAL_TABLET | Freq: Two times a day (BID) | ORAL | 0 refills | Status: DC
Start: 2021-02-21 — End: 2021-08-04

## 2021-02-21 NOTE — Assessment & Plan Note (Signed)
Rx augmentin.  

## 2021-02-21 NOTE — Progress Notes (Signed)
Virtual Visit via Video Note  I connected with Rebecca Trevino on 02/21/21 at 11:00 AM EDT by a video enabled telemedicine application and verified that I am speaking with the correct person using two identifiers.  The patient and the provider were at separate locations throughout the entire encounter. Patient location: home, Provider location: work   I discussed the limitations of evaluation and management by telemedicine and the availability of in person appointments. The patient expressed understanding and agreed to proceed. The patient and the provider were the only parties present for the visit unless noted in HPI below.  History of Present Illness: The patient is a 65 y.o. female with visit for sinus problem/infection. Started about a week or so ago. Has some pain in the sinuses and drainage. Having a lot of yellow to color drainage and odor from the nose. Having some nausea from the drainage yesterday. Denies fevers or chills. Overall it is worsening. Has tried sinus rinses and allergy medication without relief  Observations/Objective: Appearance: normal, breathing appears normal, casual grooming, abdomen does not appear distended, throat not well visualized, mental status is A and O times 3  Assessment and Plan: See problem oriented charting  Follow Up Instructions: rx augmentin 10 day course  I discussed the assessment and treatment plan with the patient. The patient was provided an opportunity to ask questions and all were answered. The patient agreed with the plan and demonstrated an understanding of the instructions.   The patient was advised to call back or seek an in-person evaluation if the symptoms worsen or if the condition fails to improve as anticipated.  Hoyt Koch, MD

## 2021-02-21 NOTE — Therapy (Signed)
[facility] [address] [place], [place], [address] Phone: [phone]   Fax:  [phone]  Physical Therapy Treatment  Patient Details  Name: Rebecca Trevino MRN: [id number] Date of Birth: 1956-03-01 Referring Provider (PT): Emelda Brothers, MD   Encounter Date: 02/21/2021   PT End of Session - 02/21/21 1450    Visit Number 2    Number of Visits 6    Date for PT Re-Evaluation 04/08/21   POC for 6 weeks, cert for 60 days   Authorization Type BCBS (VL: 30)    PT Start Time 7989    PT Stop Time 1510    PT Time Calculation (min) 23 min    Activity Tolerance Patient tolerated treatment well    Behavior During Therapy [facility] for tasks assessed/performed           Past Medical History:  Diagnosis Date  . High blood pressure     Past Surgical History:  Procedure Laterality Date  . TRANSPHENOIDAL APPROACH EXPOSURE N/A 10/10/2020   Procedure: ENDONASAL ENDOSCOPIC TRANSPHENOIDAL TUMOR RESECTION;  Surgeon: Judith Part, MD;  Location: [facility];  Service: Neurosurgery;  Laterality: N/A;    There were no vitals filed for this visit.   Subjective Assessment - 02/21/21 1448    Subjective Patient reports was feeling much better after last session. Patient reports about three days ago, began to have spinning sensation briefly. No other changes/complaints.    Patient is accompained by: Family member   Husband   Pertinent History HTN, GERD, Pituitary Adenoma with Apoplexy    Limitations House hold activities    Patient Stated Goals Alleviate the Dizziness    Currently in Pain? No/denies            Vestibular Assessment - 02/21/21 0001      Positional Testing   Dix-Hallpike Dix-Hallpike Left      Dix-Hallpike Left   Dix-Hallpike Left Duration 10 seconds    Dix-Hallpike Left Symptoms Upbeat, left rotatory nystagmus             Vestibular Treatment/Exercise - 02/21/21 0001      Vestibular Treatment/Exercise    Vestibular Treatment Provided Canalith Repositioning    Canalith Repositioning Epley Manuever Left       EPLEY MANUEVER LEFT   Number of Reps  3    Overall Response  Symptoms Resolved                PT Short Term Goals - 02/07/21 1539      PT SHORT TERM GOAL #1   Title = LTGs             PT Long Term Goals - 02/07/21 1547      PT LONG TERM GOAL #1   Title Patient will be independent with vestibular/balance HEP (All LTGs Due: 03/14/21)    Baseline no HEP established    Time 5    Period Weeks    Status New    Target Date 03/14/21      PT LONG TERM GOAL #2   Title Patient will report no dizziness with bed mobility and demonstrate negative positional testing to indicate resolution of BPPV    Baseline L BPPV    Time 5    Period Weeks    Status New    Target Date 03/14/21      PT LONG TERM GOAL #3   Title Patient will improve DPS > 55    Baseline 46  Time 5    Period Weeks    Status New                 Plan - 02/21/21 1502    Clinical Impression Statement Patient continue to present with L Upbeating Nystagmus of Short Duration indicating continued L Posterior Canal Canalithiasis. Patient demonstrating severe vertigo, and mild nauseous sensation. Completed CRM x 3 reps, with resolution of symptoms noted. WIll continue to assess and progress toward all LTGs.    Personal Factors and Comorbidities Comorbidity 3+    Comorbidities HTN, GERD, Pituitary Adenoma with Apoplexy    Examination-Activity Limitations Bed Mobility;Reach Overhead;Bend;Stairs;Locomotion Level    Examination-Participation Restrictions Occupation;Community Activity;Driving    Stability/Clinical Decision Making Stable/Uncomplicated    Rehab Potential Good    PT Frequency 1x / week    PT Duration --   5 weeks   PT Treatment/Interventions ADLs/Self Care Home Management;Cryotherapy;Moist Heat;Canalith Repostioning;Gait training;Stair training;Functional mobility training;Therapeutic  activities;Balance training;Neuromuscular re-education;Therapeutic exercise;Patient/family education;Manual techniques;Vestibular;Passive range of motion    PT Next Visit Plan Reassess L BPPV and treat as indicated. Assess Horizontal Canal. Assess DVA and M-CTSIB. Initiate HEP.    Consulted and Agree with Plan of Care Patient;Family member/caregiver    Family Member Consulted Husband           Patient will benefit from skilled therapeutic intervention in order to improve the following deficits and impairments:  Decreased activity tolerance,Decreased balance,Dizziness  Visit Diagnosis: BPPV (benign paroxysmal positional vertigo), left  Dizziness and giddiness  Unsteadiness on feet     Problem List Patient Active Problem List   Diagnosis Date Noted  . Acute sinusitis 02/21/2021  . Pituitary insufficiency ([place]) 02/01/2021  . Edema 02/01/2021  . Routine general medical examination at a health care facility 01/20/2021  . Pituitary apoplexy ([place]) 10/10/2020  . Brain mass 10/07/2020    Jones Bales, PT, DPT 02/21/2021, 3:15 PM  [facility] [address] [place], [place], [address] Phone: [phone]   Fax:  [phone]  Name: Rebecca Trevino MRN: [id number] Date of Birth: Aug 06, 1956

## 2021-03-01 ENCOUNTER — Ambulatory Visit: Payer: BC Managed Care – PPO | Attending: Neurological Surgery

## 2021-03-01 ENCOUNTER — Other Ambulatory Visit: Payer: Self-pay

## 2021-03-01 DIAGNOSIS — R2681 Unsteadiness on feet: Secondary | ICD-10-CM | POA: Diagnosis not present

## 2021-03-01 DIAGNOSIS — H8112 Benign paroxysmal vertigo, left ear: Secondary | ICD-10-CM | POA: Diagnosis not present

## 2021-03-01 DIAGNOSIS — R42 Dizziness and giddiness: Secondary | ICD-10-CM | POA: Insufficient documentation

## 2021-03-01 LAB — COLOGUARD

## 2021-03-01 LAB — EXTERNAL GENERIC LAB PROCEDURE

## 2021-03-01 NOTE — Therapy (Signed)
[facility] [address] [place], [place], [address] Phone: [phone]   Fax:  [phone]  Physical Therapy Treatment  Patient Details  Name: Rebecca Trevino MRN: [id number] Date of Birth: Mar 16, 1956 Referring Provider (PT): Emelda Brothers, MD   Encounter Date: 03/01/2021   PT End of Session - 03/01/21 1409    Visit Number 3    Number of Visits 6    Date for PT Re-Evaluation 04/08/21   POC for 6 weeks, cert for 60 days   Authorization Type BCBS (VL: 30)    PT Start Time 1408   patient arriving late   PT Stop Time 1435    PT Time Calculation (min) 27 min    Activity Tolerance Patient tolerated treatment well    Behavior During Therapy [facility] for tasks assessed/performed           Past Medical History:  Diagnosis Date  . High blood pressure     Past Surgical History:  Procedure Laterality Date  . TRANSPHENOIDAL APPROACH EXPOSURE N/A 10/10/2020   Procedure: ENDONASAL ENDOSCOPIC TRANSPHENOIDAL TUMOR RESECTION;  Surgeon: Judith Part, MD;  Location: [facility];  Service: Neurosurgery;  Laterality: N/A;    There were no vitals filed for this visit.   Subjective Assessment - 03/01/21 1410    Subjective Patient reports no dizziness since last session. No other new changes/complaints.    Patient is accompained by: Family member   Husband   Pertinent History HTN, GERD, Pituitary Adenoma with Apoplexy    Limitations House hold activities    Patient Stated Goals Alleviate the Dizziness    Currently in Pain? No/denies              [facility] PT Assessment - 03/01/21 0001      Observation/Other Assessments   Focus on Therapeutic Outcomes (FOTO)  DFS: 62.2; DPS: 65            Vestibular Assessment - 03/01/21 0001      Positional Testing   Dix-Hallpike Dix-Hallpike Left;Dix-Hallpike Right    Horizontal Canal Testing Horizontal Canal Right;Horizontal Canal Left      Dix-Hallpike Right   Dix-Hallpike Right  Duration 0    Dix-Hallpike Right Symptoms No nystagmus      Dix-Hallpike Left   Dix-Hallpike Left Duration 0    Dix-Hallpike Left Symptoms No nystagmus      Horizontal Canal Right   Horizontal Canal Right Duration 0    Horizontal Canal Right Symptoms Normal      Horizontal Canal Left   Horizontal Canal Left Duration 0    Horizontal Canal Left Symptoms Normal            [facility] Adult PT Treatment/Exercise - 03/01/21 0001      Transfers   Transfers Sit to Stand;Stand to Sit    Sit to Stand 7: Independent    Stand to Sit 7: Independent      Ambulation/Gait   Ambulation/Gait Yes    Ambulation/Gait Assistance 7: Independent    Assistive device None    Gait Pattern Within Functional Limits    Ambulation Surface Level;Indoor      Therapeutic Activites    Therapeutic Activities Other Therapeutic Activities    Other Therapeutic Activities PT educating patient on Left Epley Manuever for self management of BPPV in case of reoccurence. PT demonstrating and providing handout.           Access Code: [email or web address] URL: [email or web address] Date: 03/01/2021 Prepared by: Baldomero Lamy  Patient  Education Left Printmaker     PT Education - 03/01/21 1437    Education Details Epley Manuever    Person(s) Educated Patient;Spouse    Methods Explanation;Handout;Demonstration    Comprehension Verbalized understanding            PT Short Term Goals - 02/07/21 1539      PT SHORT TERM GOAL #1   Title = LTGs             PT Long Term Goals - 03/01/21 1437      PT LONG TERM GOAL #1   Title Patient will be independent with vestibular/balance HEP (All LTGs Due: 03/14/21)    Baseline no HEP established    Time 5    Period Weeks    Status New      PT LONG TERM GOAL #2   Title Patient will report no dizziness with bed mobility and demonstrate negative positional testing to indicate resolution of BPPV    Baseline L BPPV    Time 5    Period Weeks    Status New       PT LONG TERM GOAL #3   Title Patient will improve DPS > 55    Baseline 46; 65    Time 5    Period Weeks    Status Achieved                 Plan - 03/01/21 1437    Clinical Impression Statement Reassesesd positional testing today, with patient experiencing no symptoms or nystagmus with testing. Indicating resolution of L Posterior Canal Canalithiasis. PT educating on keeping visits scheduled at this time due to reoccurence potential. PT educating on Left Epley Manuever for completion at home upon d/c, with patient verbalizing understanding. Will continue to assess and treat as indicated.    Personal Factors and Comorbidities Comorbidity 3+    Comorbidities HTN, GERD, Pituitary Adenoma with Apoplexy    Examination-Activity Limitations Bed Mobility;Reach Overhead;Bend;Stairs;Locomotion Level    Examination-Participation Restrictions Occupation;Community Activity;Driving    Stability/Clinical Decision Making Stable/Uncomplicated    Rehab Potential Good    PT Frequency 1x / week    PT Duration --   5 weeks   PT Treatment/Interventions ADLs/Self Care Home Management;Cryotherapy;Moist Heat;Canalith Repostioning;Gait training;Stair training;Functional mobility training;Therapeutic activities;Balance training;Neuromuscular re-education;Therapeutic exercise;Patient/family education;Manual techniques;Vestibular;Passive range of motion    PT Next Visit Plan Reassess L BPPV and treat as indicated    Consulted and Agree with Plan of Care Patient;Family member/caregiver    Family Member Consulted Husband           Patient will benefit from skilled therapeutic intervention in order to improve the following deficits and impairments:  Decreased activity tolerance,Decreased balance,Dizziness  Visit Diagnosis: BPPV (benign paroxysmal positional vertigo), left  Dizziness and giddiness  Unsteadiness on feet     Problem List Patient Active Problem List   Diagnosis Date Noted  . Acute  sinusitis 02/21/2021  . Pituitary insufficiency ([place]) 02/01/2021  . Edema 02/01/2021  . Routine general medical examination at a health care facility 01/20/2021  . Pituitary apoplexy ([place]) 10/10/2020  . Brain mass 10/07/2020    Jones Bales, PT, DPT 03/01/2021, 2:39 PM  [facility] [address] [place], [place], [address] Phone: [phone]   Fax:  [phone]  Name: Rebecca Trevino MRN: [id number] Date of Birth: 08-04-1956

## 2021-03-01 NOTE — Patient Instructions (Signed)
Access Code: [email or web address] URL: [email or web address] Date: 03/01/2021 Prepared by: Baldomero Lamy  Patient Education Left Epley Maneuver

## 2021-03-02 ENCOUNTER — Encounter: Payer: Self-pay | Admitting: Internal Medicine

## 2021-03-06 ENCOUNTER — Other Ambulatory Visit: Payer: Self-pay | Admitting: Internal Medicine

## 2021-03-06 DIAGNOSIS — Z1231 Encounter for screening mammogram for malignant neoplasm of breast: Secondary | ICD-10-CM

## 2021-03-09 ENCOUNTER — Encounter: Payer: Self-pay | Admitting: Internal Medicine

## 2021-03-10 ENCOUNTER — Telehealth: Payer: Self-pay

## 2021-03-10 ENCOUNTER — Ambulatory Visit: Payer: BC Managed Care – PPO

## 2021-03-10 NOTE — Telephone Encounter (Signed)
LVM asking pt to return call to reschedule appt on 5/23 to 5/25. Also sent myChart message.

## 2021-03-11 DIAGNOSIS — Z20822 Contact with and (suspected) exposure to covid-19: Secondary | ICD-10-CM | POA: Diagnosis not present

## 2021-03-14 ENCOUNTER — Ambulatory Visit: Payer: BC Managed Care – PPO

## 2021-03-16 ENCOUNTER — Encounter: Payer: Self-pay | Admitting: Internal Medicine

## 2021-03-20 ENCOUNTER — Ambulatory Visit: Payer: BC Managed Care – PPO

## 2021-03-24 ENCOUNTER — Other Ambulatory Visit: Payer: Self-pay

## 2021-03-24 ENCOUNTER — Ambulatory Visit: Payer: BC Managed Care – PPO

## 2021-03-24 DIAGNOSIS — H8112 Benign paroxysmal vertigo, left ear: Secondary | ICD-10-CM

## 2021-03-24 DIAGNOSIS — R42 Dizziness and giddiness: Secondary | ICD-10-CM | POA: Diagnosis not present

## 2021-03-24 DIAGNOSIS — R2681 Unsteadiness on feet: Secondary | ICD-10-CM | POA: Diagnosis not present

## 2021-03-24 NOTE — Therapy (Signed)
[facility] [address] [place], [place], [address] Phone: [phone]   Fax:  [phone]  Physical Therapy Treatment/Discharge Summary  Patient Details  Name: Rebecca Trevino MRN: [id number] Date of Birth: [birth year] Referring Provider (PT): Emelda Brothers, MD  PHYSICAL THERAPY DISCHARGE SUMMARY  Visits from Start of Care: 4  Current functional level related to goals / functional outcomes: See Clinical Impression Statement   Remaining deficits: None   Education / Equipment: HEP Provided  Plan: Patient agrees to discharge.  Patient goals were met. Patient is being discharged due to meeting the stated rehab goals.  ?????         Encounter Date: 03/24/2021   PT End of Session - 03/24/21 1406    Visit Number 4    Number of Visits 6    Date for PT Re-Evaluation 04/08/21   POC for 6 weeks, cert for 60 days   Authorization Type BCBS (VL: 30)    PT Start Time 1406   patient arriving late   PT Stop Time 1420    PT Time Calculation (min) 14 min    Activity Tolerance Patient tolerated treatment well    Behavior During Therapy [facility] for tasks assessed/performed           Past Medical History:  Diagnosis Date  . High blood pressure     Past Surgical History:  Procedure Laterality Date  . TRANSPHENOIDAL APPROACH EXPOSURE N/A 10/10/2020   Procedure: ENDONASAL ENDOSCOPIC TRANSPHENOIDAL TUMOR RESECTION;  Surgeon: Judith Part, MD;  Location: [facility];  Service: Neurosurgery;  Laterality: N/A;    There were no vitals filed for this visit.   Subjective Assessment - 03/24/21 1407    Subjective Patient reports that she has not experienced any dizziness (it has been about a month). Would like to come in for last session to assess to ensure it resolved. No other new changes/complaints.    Patient is accompained by: Family member   Husband   Pertinent History HTN, GERD, Pituitary Adenoma with Apoplexy     Limitations House hold activities    Patient Stated Goals Alleviate the Dizziness    Currently in Pain? No/denies              [facility] PT Assessment - 03/24/21 0001      Assessment   Medical Diagnosis BPPV    Referring Provider (PT) Emelda Brothers, MD      Observation/Other Assessments   Focus on Therapeutic Outcomes (FOTO)  DFS: 62.2; DPS: 65             Vestibular Assessment - 03/24/21 0001      Positional Testing   Dix-Hallpike Dix-Hallpike Right;Dix-Hallpike Left    Horizontal Canal Testing Horizontal Canal Right;Horizontal Canal Left      Dix-Hallpike Right   Dix-Hallpike Right Duration 0    Dix-Hallpike Right Symptoms No nystagmus      Dix-Hallpike Left   Dix-Hallpike Left Duration 0    Dix-Hallpike Left Symptoms No nystagmus      Horizontal Canal Right   Horizontal Canal Right Duration 0    Horizontal Canal Right Symptoms Normal      Horizontal Canal Left   Horizontal Canal Left Duration 0    Horizontal Canal Left Symptoms Normal              [facility] Adult PT Treatment/Exercise - 03/24/21 0001      Therapeutic Activites    Therapeutic Activities Other Therapeutic Activities  Other Therapeutic Activities Reviewed and demonstrated with patient returning demonstration of L Epley Manuever to ensure proper completion and understanding to allow for self managmeent of symptoms in case of reoccurence of BPPV in future.                  PT Education - 03/24/21 1419    Education Details Reviewed Epley Manuever; Progress toward LTG; How to obtain new referral in case of reoccurence in future    Person(s) Educated Patient;Spouse    Methods Explanation;Demonstration    Comprehension Verbalized understanding;Returned demonstration            PT Short Term Goals - 02/07/21 1539      PT SHORT TERM GOAL #1   Title = LTGs             PT Long Term Goals - 03/24/21 1417      PT LONG TERM GOAL #1   Title Patient will be independent with  vestibular/balance HEP (All LTGs Due: 03/14/21)    Baseline Educated on L Epley (Handout Provided)    Time 5    Period Weeks    Status Achieved      PT LONG TERM GOAL #2   Title Patient will report no dizziness with bed mobility and demonstrate negative positional testing to indicate resolution of BPPV    Baseline Reports no dizziness with bed mobility, negative positional testing    Time 5    Period Weeks    Status Achieved      PT LONG TERM GOAL #3   Title Patient will improve DPS > 55    Baseline 46; 65; DFS: 62.2; DPS: 65    Time 5    Period Weeks    Status Achieved                 Plan - 03/24/21 1427    Clinical Impression Statement Completed positional testing of all canals with no nystagmus/symptoms noted. Patient demonstrating continued resolution of L Posterior Canal Canalithiasis. Reviewed Epley Manuever to ensure understanding upon d/c. Patient able to meet all LTGs during session today indiciating improved symptoms and tolerance for bed mobility. Patient demonstrating readiness for d/c, with patient verbalizing agreement.    Personal Factors and Comorbidities Comorbidity 3+    Comorbidities HTN, GERD, Pituitary Adenoma with Apoplexy    Examination-Activity Limitations Bed Mobility;Reach Overhead;Bend;Stairs;Locomotion Level    Examination-Participation Restrictions Occupation;Community Activity;Driving    Stability/Clinical Decision Making Stable/Uncomplicated    Rehab Potential Good    PT Frequency 1x / week    PT Duration --   5 weeks   PT Treatment/Interventions ADLs/Self Care Home Management;Cryotherapy;Moist Heat;Canalith Repostioning;Gait training;Stair training;Functional mobility training;Therapeutic activities;Balance training;Neuromuscular re-education;Therapeutic exercise;Patient/family education;Manual techniques;Vestibular;Passive range of motion    PT Next Visit Plan D/C    Consulted and Agree with Plan of Care Patient;Family member/caregiver     Family Member Consulted Husband           Patient will benefit from skilled therapeutic intervention in order to improve the following deficits and impairments:  Decreased activity tolerance,Decreased balance,Dizziness  Visit Diagnosis: BPPV (benign paroxysmal positional vertigo), left  Dizziness and giddiness  Unsteadiness on feet     Problem List Patient Active Problem List   Diagnosis Date Noted  . Acute sinusitis 02/21/2021  . Pituitary insufficiency ([place]) 02/01/2021  . Edema 02/01/2021  . Routine general medical examination at a health care facility 01/20/2021  . Pituitary apoplexy ([place]) 10/10/2020  . Brain mass 10/07/2020  Jones Bales, PT, DPT 03/24/2021, 2:29 PM  [facility] [address] [place], [place], [address] Phone: [phone]   Fax:  [phone]  Name: Rebecca Trevino MRN: [id number] Date of Birth: 06-17-1956

## 2021-03-30 ENCOUNTER — Telehealth: Payer: Self-pay | Admitting: Internal Medicine

## 2021-03-30 ENCOUNTER — Other Ambulatory Visit: Payer: Self-pay | Admitting: Internal Medicine

## 2021-03-30 DIAGNOSIS — Z1231 Encounter for screening mammogram for malignant neoplasm of breast: Secondary | ICD-10-CM

## 2021-03-30 NOTE — Telephone Encounter (Signed)
    [facility] calling for clarification on instructions on  ondansetron (ZOFRAN) 4 MG tablet  Please call [phone] Ref # [id number]

## 2021-03-31 NOTE — Telephone Encounter (Signed)
Pharmacy has been made aware of instructions for zofran.

## 2021-04-05 ENCOUNTER — Other Ambulatory Visit: Payer: Self-pay

## 2021-04-05 ENCOUNTER — Ambulatory Visit (INDEPENDENT_AMBULATORY_CARE_PROVIDER_SITE_OTHER): Payer: BC Managed Care – PPO

## 2021-04-05 DIAGNOSIS — Z23 Encounter for immunization: Secondary | ICD-10-CM | POA: Diagnosis not present

## 2021-04-05 NOTE — Progress Notes (Signed)
Pt here for 2nd shingle vaccine  per Dr.Crawford  Shingrix given IM, and pt tolerated injection well.

## 2021-04-06 ENCOUNTER — Ambulatory Visit: Payer: BC Managed Care – PPO

## 2021-04-06 NOTE — Telephone Encounter (Signed)
Per [facility]: "Comment: There was insufficient stool DNA detected to produce a valid Cologuard result. The patient will be contacted to initiate a new sample collection."  Pt called to discuss findings.  Pt reassured that another sample will be sent & process discussed with team.  Pt verb understanding & is appreciative of phone call. Will contact [facility] [facility] to send another kit; address verified.

## 2021-04-12 ENCOUNTER — Encounter: Payer: Self-pay | Admitting: Internal Medicine

## 2021-04-16 ENCOUNTER — Encounter: Payer: Self-pay | Admitting: Endocrinology

## 2021-04-18 ENCOUNTER — Telehealth: Payer: Self-pay | Admitting: Internal Medicine

## 2021-04-18 NOTE — Telephone Encounter (Signed)
Type of form received: Physician's release for insurance    Additional comments:   Received by: [name]    Form should be Faxed to: n/a   Form should be mailed to: Address on file   Is patient requesting call for pickup: Y    Form placed in the Provider's box.  *Attach charge sheet.  Provider will determine charge.*  Was patient informed of  7-10 business day turn around (Y/N)? Rebecca Trevino

## 2021-04-19 NOTE — Telephone Encounter (Signed)
Patient called to check status of being able to go ahead and get her labs done now as opposed to after her in office visit due to how she is feeling currently. Also patient was moved up to earlier appointment

## 2021-04-19 NOTE — Telephone Encounter (Signed)
Noted  

## 2021-04-21 ENCOUNTER — Ambulatory Visit (INDEPENDENT_AMBULATORY_CARE_PROVIDER_SITE_OTHER): Payer: BC Managed Care – PPO | Admitting: Endocrinology

## 2021-04-21 ENCOUNTER — Other Ambulatory Visit: Payer: Self-pay

## 2021-04-21 VITALS — BP 130/78 | HR 83 | Ht 66.5 in | Wt 166.5 lb

## 2021-04-21 DIAGNOSIS — E230 Hypopituitarism: Secondary | ICD-10-CM | POA: Diagnosis not present

## 2021-04-21 LAB — BASIC METABOLIC PANEL
BUN: 17 mg/dL (ref 6–23)
CO2: 28 mEq/L (ref 19–32)
Calcium: 9.5 mg/dL (ref 8.4–10.5)
Chloride: 107 mEq/L (ref 96–112)
Creatinine, Ser: 0.72 mg/dL (ref 0.40–1.20)
GFR: 86.67 mL/min (ref >60.00)
Glucose, Bld: 68 mg/dL — ABNORMAL LOW (ref 70–99)
Potassium: 4 mEq/L (ref 3.5–5.1)
Sodium: 142 mEq/L (ref 135–145)

## 2021-04-21 LAB — TSH: TSH: 0.01 u[IU]/mL — ABNORMAL LOW (ref 0.35–4.50)

## 2021-04-21 LAB — LUTEINIZING HORMONE: LH: 3.4 m[IU]/mL

## 2021-04-21 LAB — FOLLICLE STIMULATING HORMONE: FSH: 13.6 m[IU]/mL

## 2021-04-21 LAB — T4, FREE: Free T4: 1.65 ng/dL — ABNORMAL HIGH (ref 0.60–1.60)

## 2021-04-21 LAB — CORTISOL: Cortisol, Plasma: 9.7 ug/dL

## 2021-04-21 NOTE — Patient Instructions (Signed)
Blood tests are requested for you today.  We'll let you know about the results.   Please come back for a follow-up appointment on 04/27/21, at 4:30PM.

## 2021-04-21 NOTE — Progress Notes (Signed)
Subjective:    Patient ID: Rebecca Trevino, female    DOB: [birth year], 65 y.o.   MRN: [id number]  HPI Pt returns for f/u of pituitar insuff (she had resect of large pituitary adenoma in 2021, with poss apoplexy).   He has these pituitary functions: ACTH: needs and takes prednisone. Prolactin: normal FSH/LH: low TSH: needs and takes synthroid.   VP: normal labs.  Interval hx: Nocturia is still mild. She reports fatigue and lightheadedness.  She wants to come back soon, to discuss results.   Past Medical History:  Diagnosis Date   High blood pressure     Past Surgical History:  Procedure Laterality Date   TRANSPHENOIDAL APPROACH EXPOSURE N/A 10/10/2020   Procedure: ENDONASAL ENDOSCOPIC TRANSPHENOIDAL TUMOR RESECTION;  Surgeon: Judith Part, MD;  Location: [facility];  Service: Neurosurgery;  Laterality: N/A;    Social History   Socioeconomic History   Marital status: Married    Spouse name: Not on file   Number of children: Not on file   Years of education: Not on file   Highest education level: Not on file  Occupational History   Not on file  Tobacco Use   Smoking status: Never   Smokeless tobacco: Never  Substance and Sexual Activity   Alcohol use: Not on file   Drug use: Not on file   Sexual activity: Not on file  Other Topics Concern   Not on file  Social History Narrative   Not on file   Social Determinants of Health   Financial Resource Strain: Not on file  Food Insecurity: Not on file  Transportation Needs: Not on file  Physical Activity: Not on file  Stress: Not on file  Social Connections: Not on file  Intimate Partner Violence: Not on file    Current Outpatient Medications on File Prior to Visit  Medication Sig Dispense Refill   albuterol (VENTOLIN HFA) 108 (90 Base) MCG/ACT inhaler Inhale 1-2 puffs into the lungs as needed for wheezing or shortness of breath. 1 each 0   hydrOXYzine (ATARAX/VISTARIL) 25 MG tablet Take 1 tablet (25 mg total) by mouth  at bedtime as needed (sleep). 90 tablet 1   levothyroxine (SYNTHROID) 75 MCG tablet Take 1 tablet (75 mcg total) by mouth daily. 90 tablet 3   Magnesium 500 MG TABS Take 500 mg by mouth daily.     Multiple Vitamins-Minerals (MULTIVITAMIN WITH MINERALS) tablet Take 1 tablet by mouth daily.     omeprazole (PRILOSEC) 20 MG capsule Take 1 capsule (20 mg total) by mouth daily. 90 capsule 3   ondansetron (ZOFRAN) 4 MG tablet Take 1 tablet (4 mg total) by mouth as needed for nausea or vomiting. 20 tablet 1   predniSONE (DELTASONE) 5 MG tablet Take 1 tablet (5 mg total) by mouth every morning. 90 tablet 5   amoxicillin-clavulanate (AUGMENTIN) 875-125 MG tablet Take 1 tablet by mouth 2 (two) times daily. 20 tablet 0   Calcium Carb-Cholecalciferol 600-200 MG-UNIT TABS Take 1 tablet by mouth daily.     ibuprofen (ADVIL) 600 MG tablet Take 600 mg by mouth 3 (three) times daily as needed for mild pain or headache.     No current facility-administered medications on file prior to visit.    Allergies  Allergen Reactions   Codeine Nausea And Vomiting   Erythromycin Nausea And Vomiting    No family history on file.  BP 130/78 (BP Location: Right Arm, Patient Position: Sitting, Cuff Size: Normal)   Pulse 83  Ht 5' 6.5" (1.689 m)   Wt 166 lb 8 oz (75.5 kg)   SpO2 98%   BMI 26.47 kg/m    Review of Systems Headache is mild.  No visual loss.  No weight change.  Denies n/v.      Objective:   Physical Exam VITAL SIGNS:  See vs page GENERAL: no distress EXT: no leg edema.    Lab Results  Component Value Date   TSH 0.01 (L) 04/21/2021   Cortisol=10    Assessment & Plan:  Central hypothyroidism: as labs have varied on same rx, I advised pt to continue the same synthroid.   Central hypocortisolemia: check ACTH.    Patient Instructions  Blood tests are requested for you today.  We'll let you know about the results.   Please come back for a follow-up appointment on 04/27/21, at 4:30PM.

## 2021-04-25 LAB — ACTH: C206 ACTH: 14 pg/mL (ref 6–50)

## 2021-04-27 ENCOUNTER — Ambulatory Visit (INDEPENDENT_AMBULATORY_CARE_PROVIDER_SITE_OTHER): Payer: BC Managed Care – PPO | Admitting: Endocrinology

## 2021-04-27 ENCOUNTER — Other Ambulatory Visit: Payer: Self-pay

## 2021-04-27 VITALS — BP 142/78 | HR 72 | Ht 66.5 in | Wt 167.8 lb

## 2021-04-27 DIAGNOSIS — E230 Hypopituitarism: Secondary | ICD-10-CM | POA: Diagnosis not present

## 2021-04-27 MED ORDER — PREDNISONE 1 MG PO TBEC: 4.0000 mg | DELAYED_RELEASE_TABLET | ORAL | 3 refills | Status: DC

## 2021-04-27 MED ORDER — LEVOTHYROXINE SODIUM 50 MCG PO TABS: 50.0000 ug | ORAL_TABLET | Freq: Every day | ORAL | 3 refills | Status: DC

## 2021-04-27 NOTE — Progress Notes (Signed)
Subjective:    Patient ID: Rebecca Trevino, female    DOB: 1956-09-10, 65 y.o.   MRN: [id number]  HPI Pt returns for f/u of pituitar insuff (she had resect of large pituitary adenoma in 2021, with poss apoplexy).   He has these pituitary functions: ACTH: needs and takes prednisone.  Prolactin: normal FSH/LH: low TSH: needs and takes synthroid.   VP: normal labs.  Interval hx: she reports irritability and fatigue.  She takes meds as rx'ed, but she also takes biotin.   Past Medical History:  Diagnosis Date   High blood pressure     Past Surgical History:  Procedure Laterality Date   TRANSPHENOIDAL APPROACH EXPOSURE N/A 10/10/2020   Procedure: ENDONASAL ENDOSCOPIC TRANSPHENOIDAL TUMOR RESECTION;  Surgeon: Judith Part, MD;  Location: [facility];  Service: Neurosurgery;  Laterality: N/A;    Social History   Socioeconomic History   Marital status: Married    Spouse name: Not on file   Number of children: Not on file   Years of education: Not on file   Highest education level: Not on file  Occupational History   Not on file  Tobacco Use   Smoking status: Never   Smokeless tobacco: Never  Substance and Sexual Activity   Alcohol use: Not on file   Drug use: Not on file   Sexual activity: Not on file  Other Topics Concern   Not on file  Social History Narrative   Not on file   Social Determinants of Health   Financial Resource Strain: Not on file  Food Insecurity: Not on file  Transportation Needs: Not on file  Physical Activity: Not on file  Stress: Not on file  Social Connections: Not on file  Intimate Partner Violence: Not on file    Current Outpatient Medications on File Prior to Visit  Medication Sig Dispense Refill   albuterol (VENTOLIN HFA) 108 (90 Base) MCG/ACT inhaler Inhale 1-2 puffs into the lungs as needed for wheezing or shortness of breath. 1 each 0   hydrOXYzine (ATARAX/VISTARIL) 25 MG tablet Take 1 tablet (25 mg total) by mouth at bedtime as needed  (sleep). 90 tablet 1   Magnesium 500 MG TABS Take 500 mg by mouth daily.     Multiple Vitamins-Minerals (MULTIVITAMIN WITH MINERALS) tablet Take 1 tablet by mouth daily.     omeprazole (PRILOSEC) 20 MG capsule Take 1 capsule (20 mg total) by mouth daily. 90 capsule 3   ondansetron (ZOFRAN) 4 MG tablet Take 1 tablet (4 mg total) by mouth as needed for nausea or vomiting. 20 tablet 1   amoxicillin-clavulanate (AUGMENTIN) 875-125 MG tablet Take 1 tablet by mouth 2 (two) times daily. 20 tablet 0   Calcium Carb-Cholecalciferol 600-200 MG-UNIT TABS Take 1 tablet by mouth daily.     ibuprofen (ADVIL) 600 MG tablet Take 600 mg by mouth 3 (three) times daily as needed for mild pain or headache.     No current facility-administered medications on file prior to visit.    Allergies  Allergen Reactions   Codeine Nausea And Vomiting   Erythromycin Nausea And Vomiting    No family history on file.  BP (!) 142/78 (BP Location: Right Arm, Patient Position: Sitting, Cuff Size: Normal)   Pulse 72   Ht 5' 6.5" (1.689 m)   Wt 167 lb 12.8 oz (76.1 kg)   SpO2 99%   BMI 26.68 kg/m   Review of Systems     Objective:   Physical Exam EXT: trace  bilat leg edema GAIT: normal and steady  Lab Results  Component Value Date   TSH 0.01 (L) 04/21/2021  Cortisol=10    Assessment & Plan:  Central hypothyroidism.  Overcontrolled, despite the fact that biotin limits interpretation.  Pituitary insuff:  she may be recovering some pituitary function.    Patient Instructions  I have sent 2 prescriptions to your pharmacy: to reduce the levothyroxine, and to reduce the prednisone.   Please come back for a follow-up appointment in 3-4 months, with blood tests 1 week prior.  Please skip the biotin for 5 days prior to the blood.

## 2021-04-27 NOTE — Patient Instructions (Addendum)
I have sent 2 prescriptions to your pharmacy: to reduce the levothyroxine, and to reduce the prednisone.   Please come back for a follow-up appointment in 3-4 months, with blood tests 1 week prior.  Please skip the biotin for 5 days prior to the blood.

## 2021-05-04 ENCOUNTER — Ambulatory Visit: Payer: BC Managed Care – PPO | Admitting: Endocrinology

## 2021-05-04 NOTE — Telephone Encounter (Signed)
See below. Not sure what form is she talking about. I wasn't here when it was dropped off

## 2021-05-08 NOTE — Telephone Encounter (Signed)
   Patient called and is wanting to pick up the original paper. She can be reached at [phone]

## 2021-05-08 NOTE — Telephone Encounter (Signed)
Patient is aware that paperwork is upfront for pickup. No other questions or concerns at this time.

## 2021-05-12 DIAGNOSIS — H5347 Heteronymous bilateral field defects: Secondary | ICD-10-CM | POA: Diagnosis not present

## 2021-05-24 DIAGNOSIS — Z1231 Encounter for screening mammogram for malignant neoplasm of breast: Secondary | ICD-10-CM

## 2021-06-15 ENCOUNTER — Encounter: Payer: Self-pay | Admitting: Internal Medicine

## 2021-06-28 ENCOUNTER — Other Ambulatory Visit: Payer: Self-pay

## 2021-06-28 ENCOUNTER — Ambulatory Visit
Admission: RE | Admit: 2021-06-28 | Discharge: 2021-06-28 | Disposition: A | Discharge status: Home / Self Care | Payer: Medicare Other | Source: Ambulatory Visit | Attending: Internal Medicine | Admitting: Internal Medicine

## 2021-06-28 DIAGNOSIS — Z1231 Encounter for screening mammogram for malignant neoplasm of breast: Secondary | ICD-10-CM | POA: Diagnosis not present

## 2021-07-10 ENCOUNTER — Encounter: Payer: Self-pay | Admitting: Internal Medicine

## 2021-07-10 DIAGNOSIS — Z1283 Encounter for screening for malignant neoplasm of skin: Secondary | ICD-10-CM

## 2021-07-11 NOTE — Telephone Encounter (Signed)
Hey can guys reschedule this patient since Dr. Sharlet Salina will not be in office. Thank you

## 2021-07-14 ENCOUNTER — Other Ambulatory Visit: Payer: Self-pay

## 2021-07-14 ENCOUNTER — Ambulatory Visit (INDEPENDENT_AMBULATORY_CARE_PROVIDER_SITE_OTHER): Payer: Medicare Other

## 2021-07-14 DIAGNOSIS — Z23 Encounter for immunization: Secondary | ICD-10-CM | POA: Diagnosis not present

## 2021-07-14 NOTE — Progress Notes (Signed)
Pt was given High dose Flu vacc w/o any complications.

## 2021-07-17 ENCOUNTER — Encounter: Payer: Self-pay | Admitting: Endocrinology

## 2021-07-18 ENCOUNTER — Ambulatory Visit: Payer: BC Managed Care – PPO | Admitting: Internal Medicine

## 2021-07-19 DIAGNOSIS — Z1211 Encounter for screening for malignant neoplasm of colon: Secondary | ICD-10-CM | POA: Diagnosis not present

## 2021-07-19 LAB — COLOGUARD: Cologuard: NEGATIVE

## 2021-07-23 LAB — COLOGUARD: COLOGUARD: NEGATIVE

## 2021-07-23 LAB — EXTERNAL GENERIC LAB PROCEDURE: COLOGUARD: NEGATIVE

## 2021-07-25 ENCOUNTER — Ambulatory Visit: Payer: BC Managed Care – PPO | Admitting: Endocrinology

## 2021-07-28 ENCOUNTER — Encounter: Payer: Self-pay | Admitting: Internal Medicine

## 2021-08-04 ENCOUNTER — Encounter: Payer: Self-pay | Admitting: Internal Medicine

## 2021-08-04 ENCOUNTER — Ambulatory Visit (INDEPENDENT_AMBULATORY_CARE_PROVIDER_SITE_OTHER): Payer: Medicare Other | Admitting: Internal Medicine

## 2021-08-04 ENCOUNTER — Other Ambulatory Visit: Payer: Self-pay

## 2021-08-04 VITALS — BP 124/80 | HR 82 | Resp 18 | Ht 66.5 in | Wt 166.4 lb

## 2021-08-04 DIAGNOSIS — Z1283 Encounter for screening for malignant neoplasm of skin: Secondary | ICD-10-CM

## 2021-08-04 DIAGNOSIS — E2839 Other primary ovarian failure: Secondary | ICD-10-CM

## 2021-08-04 DIAGNOSIS — Z13220 Encounter for screening for lipoid disorders: Secondary | ICD-10-CM

## 2021-08-04 DIAGNOSIS — E230 Hypopituitarism: Secondary | ICD-10-CM | POA: Diagnosis not present

## 2021-08-04 NOTE — Assessment & Plan Note (Signed)
Given chronic steroids needs DEXA as none in some time. Ordered today.

## 2021-08-04 NOTE — Patient Instructions (Addendum)
We will check the cholesterol and the bone density.   Okay to schedule her husband Jan or Feb per their preference.

## 2021-08-04 NOTE — Progress Notes (Signed)
   Subjective:   Patient ID: Rebecca Trevino, female    DOB: 04-Nov-1955, 65 y.o.   MRN: [id number]  HPI The patient is a 65 YO female coming in for follow up.   Review of Systems  Constitutional: Negative.   HENT: Negative.    Eyes: Negative.   Respiratory:  Negative for cough, chest tightness and shortness of breath.   Cardiovascular:  Negative for chest pain, palpitations and leg swelling.  Gastrointestinal:  Negative for abdominal distention, abdominal pain, constipation, diarrhea, nausea and vomiting.  Musculoskeletal: Negative.   Skin: Negative.   Neurological: Negative.   Psychiatric/Behavioral: Negative.     Objective:  Physical Exam Constitutional:      Appearance: She is well-developed.  HENT:     Head: Normocephalic and atraumatic.  Cardiovascular:     Rate and Rhythm: Normal rate and regular rhythm.  Pulmonary:     Effort: Pulmonary effort is normal. No respiratory distress.     Breath sounds: Normal breath sounds. No wheezing or rales.  Abdominal:     General: Bowel sounds are normal. There is no distension.     Palpations: Abdomen is soft.     Tenderness: There is no abdominal tenderness. There is no rebound.  Musculoskeletal:     Cervical back: Normal range of motion.  Skin:    General: Skin is warm and dry.  Neurological:     Mental Status: She is alert and oriented to person, place, and time.     Coordination: Coordination normal.    Vitals:   08/04/21 1350  BP: 124/80  Pulse: 82  Resp: 18  SpO2: 98%  Weight: 166 lb 6.4 oz (75.5 kg)  Height: 5' 6.5" (1.689 m)    This visit occurred during the SARS-CoV-2 public health emergency.  Safety protocols were in place, including screening questions prior to the visit, additional usage of staff PPE, and extensive cleaning of exam room while observing appropriate contact time as indicated for disinfecting solutions.   Assessment & Plan:

## 2021-08-07 ENCOUNTER — Other Ambulatory Visit: Payer: Self-pay

## 2021-08-07 ENCOUNTER — Ambulatory Visit (INDEPENDENT_AMBULATORY_CARE_PROVIDER_SITE_OTHER)
Admission: RE | Admit: 2021-08-07 | Discharge: 2021-08-07 | Disposition: A | Discharge status: Home / Self Care | Payer: Medicare Other | Source: Ambulatory Visit | Attending: Internal Medicine | Admitting: Internal Medicine

## 2021-08-07 DIAGNOSIS — E2839 Other primary ovarian failure: Secondary | ICD-10-CM | POA: Diagnosis not present

## 2021-08-13 ENCOUNTER — Other Ambulatory Visit: Payer: Self-pay | Admitting: Internal Medicine

## 2021-08-14 ENCOUNTER — Other Ambulatory Visit (INDEPENDENT_AMBULATORY_CARE_PROVIDER_SITE_OTHER): Payer: Medicare Other

## 2021-08-14 ENCOUNTER — Other Ambulatory Visit: Payer: Self-pay

## 2021-08-14 DIAGNOSIS — E230 Hypopituitarism: Secondary | ICD-10-CM | POA: Diagnosis not present

## 2021-08-14 LAB — BASIC METABOLIC PANEL
BUN: 17 mg/dL (ref 6–23)
CO2: 26 mEq/L (ref 19–32)
Calcium: 9.4 mg/dL (ref 8.4–10.5)
Chloride: 104 mEq/L (ref 96–112)
Creatinine, Ser: 0.79 mg/dL (ref 0.40–1.20)
GFR: 77.37 mL/min (ref >60.00)
Glucose, Bld: 98 mg/dL (ref 70–99)
Potassium: 4.2 mEq/L (ref 3.5–5.1)
Sodium: 138 mEq/L (ref 135–145)

## 2021-08-14 LAB — TSH: TSH: 0.93 u[IU]/mL (ref 0.35–5.50)

## 2021-08-14 LAB — T4, FREE: Free T4: 0.79 ng/dL (ref 0.60–1.60)

## 2021-08-14 LAB — LUTEINIZING HORMONE: LH: 3.44 m[IU]/mL

## 2021-08-14 LAB — FOLLICLE STIMULATING HORMONE: FSH: 13.8 m[IU]/mL

## 2021-08-14 LAB — CORTISOL: Cortisol, Plasma: 6.3 ug/dL

## 2021-08-16 LAB — ACTH: C206 ACTH: <5 pg/mL — ABNORMAL LOW (ref 6–50)

## 2021-08-21 ENCOUNTER — Ambulatory Visit (INDEPENDENT_AMBULATORY_CARE_PROVIDER_SITE_OTHER): Payer: Medicare Other | Admitting: Endocrinology

## 2021-08-21 ENCOUNTER — Other Ambulatory Visit: Payer: Self-pay

## 2021-08-21 VITALS — BP 120/70 | HR 87 | Ht 66.5 in | Wt 167.7 lb

## 2021-08-21 DIAGNOSIS — E230 Hypopituitarism: Secondary | ICD-10-CM | POA: Diagnosis not present

## 2021-08-21 NOTE — Patient Instructions (Signed)
Please continue the same medications.   Please come back for a follow-up appointment in 6 months.    

## 2021-08-21 NOTE — Progress Notes (Signed)
Subjective:    Patient ID: Rebecca Trevino, female    DOB: 18-Jan-1956, 65 y.o.   MRN: [id number]  HPI Pt returns for f/u of pituitary insuff (she had resect of large pituitary adenoma in 2021, with poss apoplexy; f/u in 2022: No residual tumor; Sella is enlarged. Probable small amount of pituitary is present to the right of midline.).   He has these pituitary functions: ACTH: needs and takes prednisone.  Prolactin: normal FSH/LH: low TSH: needs and takes synthroid.   VP: normal labs.  Interval hx: she reports "hot flashes."  She takes meds as rx'ed.   Past Medical History:  Diagnosis Date   High blood pressure     Past Surgical History:  Procedure Laterality Date   TRANSPHENOIDAL APPROACH EXPOSURE N/A 10/10/2020   Procedure: ENDONASAL ENDOSCOPIC TRANSPHENOIDAL TUMOR RESECTION;  Surgeon: Judith Part, MD;  Location: [facility];  Service: Neurosurgery;  Laterality: N/A;    Social History   Socioeconomic History   Marital status: Married    Spouse name: Not on file   Number of children: Not on file   Years of education: Not on file   Highest education level: Not on file  Occupational History   Not on file  Tobacco Use   Smoking status: Never   Smokeless tobacco: Never  Substance and Sexual Activity   Alcohol use: Not on file   Drug use: Not on file   Sexual activity: Not on file  Other Topics Concern   Not on file  Social History Narrative   Not on file   Social Determinants of Health   Financial Resource Strain: Not on file  Food Insecurity: Not on file  Transportation Needs: Not on file  Physical Activity: Not on file  Stress: Not on file  Social Connections: Not on file  Intimate Partner Violence: Not on file    Current Outpatient Medications on File Prior to Visit  Medication Sig Dispense Refill   albuterol (VENTOLIN HFA) 108 (90 Base) MCG/ACT inhaler Inhale 1-2 puffs into the lungs as needed for wheezing or shortness of breath. 1 each 0   hydrOXYzine  (ATARAX/VISTARIL) 25 MG tablet TAKE 1 TABLET BY MOUTH AT  BEDTIME AS NEEDED FOR SLEEP 90 tablet 1   levothyroxine (SYNTHROID) 50 MCG tablet Take 1 tablet (50 mcg total) by mouth daily. 90 tablet 3   Magnesium 500 MG TABS Take 500 mg by mouth daily.     Multiple Vitamins-Minerals (MULTIVITAMIN WITH MINERALS) tablet Take 1 tablet by mouth daily.     omeprazole (PRILOSEC) 20 MG capsule Take 1 capsule (20 mg total) by mouth daily. 90 capsule 3   ondansetron (ZOFRAN) 4 MG tablet Take 1 tablet (4 mg total) by mouth as needed for nausea or vomiting. 20 tablet 1   predniSONE 1 MG TBEC Take 4 mg by mouth every morning. 360 tablet 3   No current facility-administered medications on file prior to visit.    Allergies  Allergen Reactions   Codeine Nausea And Vomiting   Erythromycin Nausea And Vomiting    Family History  Problem Relation Age of Onset   Breast cancer Neg Hx     BP 120/70 (BP Location: Right Arm, Patient Position: Sitting, Cuff Size: Normal)   Pulse 87   Ht 5' 6.5" (1.689 m)   Wt 167 lb 11.2 oz (76.1 kg)   SpO2 97%   BMI 26.66 kg/m    Review of Systems Denies polyuria.    Objective:  Physical Exam VITAL SIGNS:  See vs page GENERAL: no distress EXT: no leg edema   Lab Results  Component Value Date   TSH 0.93 08/14/2021      Assessment & Plan:  Central hypothyroidism: well-controlled.  Please continue the same synthroid. HPA insuff: labs say this dosage of prednisone is appropriate. Hot flashes: no endocrine cause is found.  Patient Instructions  Please continue the same medications.   Please come back for a follow-up appointment in 6 months.

## 2021-09-11 ENCOUNTER — Telehealth: Payer: Self-pay | Admitting: Dermatology

## 2021-09-11 NOTE — Telephone Encounter (Signed)
Patient is calling for a referral appointment from Pricilla Holm, M.D.  Patient is scheduled for 12/25/2021 at 11:15 with Lavonna Monarch, M.D.

## 2021-09-11 NOTE — Telephone Encounter (Signed)
Referral opened back up and attached to appointment.

## 2021-10-04 ENCOUNTER — Encounter: Payer: Self-pay | Admitting: Internal Medicine

## 2021-11-20 ENCOUNTER — Encounter: Payer: Self-pay | Admitting: Internal Medicine

## 2021-11-20 DIAGNOSIS — E230 Hypopituitarism: Secondary | ICD-10-CM

## 2021-11-27 ENCOUNTER — Other Ambulatory Visit: Payer: Self-pay | Admitting: Internal Medicine

## 2021-12-12 ENCOUNTER — Encounter: Payer: Self-pay | Admitting: Endocrinology

## 2021-12-12 ENCOUNTER — Other Ambulatory Visit: Payer: Self-pay | Admitting: Endocrinology

## 2021-12-12 DIAGNOSIS — E230 Hypopituitarism: Secondary | ICD-10-CM

## 2021-12-25 ENCOUNTER — Ambulatory Visit: Payer: Medicare Other | Admitting: Dermatology

## 2021-12-25 ENCOUNTER — Other Ambulatory Visit: Payer: Self-pay

## 2021-12-25 ENCOUNTER — Encounter: Payer: Self-pay | Admitting: Dermatology

## 2021-12-25 DIAGNOSIS — L729 Follicular cyst of the skin and subcutaneous tissue, unspecified: Secondary | ICD-10-CM

## 2021-12-25 DIAGNOSIS — Z1283 Encounter for screening for malignant neoplasm of skin: Secondary | ICD-10-CM | POA: Diagnosis not present

## 2021-12-25 DIAGNOSIS — Z86018 Personal history of other benign neoplasm: Secondary | ICD-10-CM

## 2021-12-26 ENCOUNTER — Encounter: Payer: Self-pay | Admitting: Dermatology

## 2022-01-04 NOTE — Progress Notes (Signed)
° °  Follow-Up Visit   Subjective  Rebecca Trevino is a 66 y.o. female who presents for the following: New Patient (Initial Visit) (Patient here today for growth right inner eye x years per patient no bleeding, no pain, no change. Patient states that she has a lesion on her right melolabial fold x years no bleeding, no pain. Personal history of atypical mole. No personal history of melanoma or non mole skin cancer. No family history of atypical moles, melanoma or non mole skin cancer. ).  General skin examination, several areas of concern Location:  Duration:  Quality:  Associated Signs/Symptoms: Modifying Factors:  Severity:  Timing: Context:   Objective  Well appearing patient in no apparent distress; mood and affect are within normal limits. Waist Up Waist up skin examination: No atypical pigmented lesions or nonmole skin cancer.  Right Upper Eyelid 4 mm white to normal papule    A full examination was performed including scalp, head, eyes, ears, nose, lips, neck, chest, axillae, abdomen, back, buttocks, bilateral upper extremities, bilateral lower extremities, hands, feet, fingers, toes, fingernails, and toenails. All findings within normal limits unless otherwise noted below.  Areas beneath undergarments not fully examined.   Assessment & Plan    Skin exam for malignant neoplasm Waist Up  Yearly skin check.  Encouraged to self examine twice annually.  Continue ultraviolet protection.  Cyst of skin Right Upper Eyelid  Benign okay to leave unless patient wants removed.       I, Lavonna Monarch, MD, have reviewed all documentation for this visit.  The documentation on 01/04/22 for the exam, diagnosis, procedures, and orders are all accurate and complete.

## 2022-01-18 ENCOUNTER — Other Ambulatory Visit: Payer: Self-pay | Admitting: Endocrinology

## 2022-01-29 ENCOUNTER — Other Ambulatory Visit (INDEPENDENT_AMBULATORY_CARE_PROVIDER_SITE_OTHER): Payer: Medicare Other

## 2022-01-29 DIAGNOSIS — E230 Hypopituitarism: Secondary | ICD-10-CM

## 2022-01-29 LAB — LIPID PANEL
Cholesterol: 220 mg/dL — ABNORMAL HIGH (ref 0–200)
HDL: 53.8 mg/dL (ref >39.00)
NonHDL: 166.05
Total CHOL/HDL Ratio: 4
Triglycerides: 218 mg/dL — ABNORMAL HIGH (ref 0.0–149.0)
VLDL: 43.6 mg/dL — ABNORMAL HIGH (ref 0.0–40.0)

## 2022-01-29 LAB — CBC
HCT: 37.5 % (ref 36.0–46.0)
Hemoglobin: 12.7 g/dL (ref 12.0–15.0)
MCHC: 33.8 g/dL (ref 30.0–36.0)
MCV: 88.9 fl (ref 78.0–100.0)
Platelets: 255 10*3/uL (ref 150.0–400.0)
RBC: 4.22 Mil/uL (ref 3.87–5.11)
RDW: 13.9 % (ref 11.5–15.5)
WBC: 4.8 10*3/uL (ref 4.0–10.5)

## 2022-01-29 LAB — HEMOGLOBIN A1C: Hgb A1c MFr Bld: 5.5 % (ref 4.6–6.5)

## 2022-01-29 LAB — COMPREHENSIVE METABOLIC PANEL
ALT: 15 U/L (ref 0–35)
AST: 18 U/L (ref 0–37)
Albumin: 4.1 g/dL (ref 3.5–5.2)
Alkaline Phosphatase: 65 U/L (ref 39–117)
BUN: 13 mg/dL (ref 6–23)
CO2: 29 mEq/L (ref 19–32)
Calcium: 9.2 mg/dL (ref 8.4–10.5)
Chloride: 104 mEq/L (ref 96–112)
Creatinine, Ser: 0.79 mg/dL (ref 0.40–1.20)
GFR: 77.12 mL/min (ref >60.00)
Glucose, Bld: 92 mg/dL (ref 70–99)
Potassium: 4.1 mEq/L (ref 3.5–5.1)
Sodium: 138 mEq/L (ref 135–145)
Total Bilirubin: 0.4 mg/dL (ref 0.2–1.2)
Total Protein: 6.9 g/dL (ref 6.0–8.3)

## 2022-01-29 LAB — LDL CHOLESTEROL, DIRECT: Direct LDL: 130 mg/dL

## 2022-01-29 LAB — TSH: TSH: 2.32 u[IU]/mL (ref 0.35–5.50)

## 2022-01-31 ENCOUNTER — Other Ambulatory Visit: Payer: Self-pay | Admitting: Neurological Surgery

## 2022-01-31 DIAGNOSIS — D352 Benign neoplasm of pituitary gland: Secondary | ICD-10-CM

## 2022-02-05 ENCOUNTER — Encounter: Payer: Medicare Other | Admitting: Internal Medicine

## 2022-02-05 ENCOUNTER — Encounter: Payer: Self-pay | Admitting: Internal Medicine

## 2022-02-14 ENCOUNTER — Other Ambulatory Visit: Payer: Medicare Other

## 2022-02-15 ENCOUNTER — Encounter: Payer: Self-pay | Admitting: Dermatology

## 2022-02-15 ENCOUNTER — Ambulatory Visit (INDEPENDENT_AMBULATORY_CARE_PROVIDER_SITE_OTHER): Payer: Medicare Other | Admitting: Dermatology

## 2022-02-15 DIAGNOSIS — D485 Neoplasm of uncertain behavior of skin: Secondary | ICD-10-CM

## 2022-02-15 DIAGNOSIS — H02823 Cysts of right eye, unspecified eyelid: Secondary | ICD-10-CM

## 2022-02-15 DIAGNOSIS — L720 Epidermal cyst: Secondary | ICD-10-CM

## 2022-02-15 NOTE — Patient Instructions (Signed)

## 2022-02-16 ENCOUNTER — Encounter: Payer: Self-pay | Admitting: Internal Medicine

## 2022-02-16 ENCOUNTER — Ambulatory Visit (INDEPENDENT_AMBULATORY_CARE_PROVIDER_SITE_OTHER): Payer: Medicare Other | Admitting: Internal Medicine

## 2022-02-16 VITALS — BP 118/80 | HR 56 | Resp 18 | Ht 66.5 in | Wt 162.6 lb

## 2022-02-16 DIAGNOSIS — K219 Gastro-esophageal reflux disease without esophagitis: Secondary | ICD-10-CM | POA: Diagnosis not present

## 2022-02-16 DIAGNOSIS — Z9189 Other specified personal risk factors, not elsewhere classified: Secondary | ICD-10-CM | POA: Diagnosis not present

## 2022-02-16 DIAGNOSIS — Z23 Encounter for immunization: Secondary | ICD-10-CM

## 2022-02-16 DIAGNOSIS — Z0000 Encounter for general adult medical examination without abnormal findings: Secondary | ICD-10-CM | POA: Diagnosis not present

## 2022-02-16 NOTE — Assessment & Plan Note (Signed)
Flu shot yearly. Covid-19 counseled. Pneumonia 20 given at visit. Shingrix complete. Tetanus counseled to get at pharmacy. Cologuard due 2025. Mammogram due 2024, pap smear aged out and dexa due 2024. Counseled about sun safety and mole surveillance. Counseled about the dangers of distracted driving. Given 10 year screening recommendations.  ? ?

## 2022-02-16 NOTE — Assessment & Plan Note (Signed)
Taking omeprazole 20 mg daily and controlling symptoms well. Will continue. ?

## 2022-02-16 NOTE — Assessment & Plan Note (Signed)
Ordered CT calcium score to assess risk. 10 year risk about 6% calculated. We discussed statin and she is willing if calcium score dictates.  ?

## 2022-02-16 NOTE — Progress Notes (Signed)
? ?Subjective:  ? ?Patient ID: Rebecca Trevino, female    DOB: [birth year], 66 y.o.   MRN: [id number] ? ?HPI ?Here for medicare wellness and physical, no new complaints. Please see A/P for status and treatment of chronic medical problems.  ? ?Diet: heart healthy ?Physical activity: sedentary, active ?Depression/mood screen: negative ?Hearing: intact to whispered voice ?Visual acuity: grossly normal with lens, performs annual eye exam  ?ADLs: capable ?Fall risk: low ?Home safety: good ?Cognitive evaluation: intact to orientation, naming, recall and repetition ?EOL planning: adv directives discussed, not in place ? ?[facility] Office Visit from 02/16/2022 in [facility] at [place]  ?PHQ-2 Total Score 0  ? ?  ?  ? ? ?  10/10/2020  ?  8:00 PM 10/11/2020  ?  8:00 AM 10/12/2020  ?  8:00 AM 10/12/2020  ?  9:00 PM 10/13/2020  ?  8:15 AM  ?Fall Risk  ?Patient Fall Risk Level High fall risk High fall risk High fall risk High fall risk High fall risk  ? ? ?I have personally reviewed and have noted ?1. The patient's medical and social history - reviewed today no changes ?2. Their use of alcohol, tobacco or illicit drugs ?3. Their current medications and supplements ?4. The patient's functional ability including ADL's, fall risks, home safety risks and hearing or visual impairment. ?5. Diet and physical activities ?6. Evidence for depression or mood disorders ?7. Care team reviewed and updated ?8.  The patient is not on an opioid pain medication. ? ?Patient Care Team: ?Hoyt Koch, MD as PCP - General (Internal Medicine) ?Lavonna Monarch, MD as Consulting Physician (Dermatology) ?Past Medical History:  ?Diagnosis Date  ? High blood pressure   ? ?Past Surgical History:  ?Procedure Laterality Date  ? TRANSPHENOIDAL APPROACH EXPOSURE N/A 10/10/2020  ? Procedure: ENDONASAL ENDOSCOPIC TRANSPHENOIDAL TUMOR RESECTION;  Surgeon: Judith Part, MD;  Location: [facility];  Service: Neurosurgery;  Laterality: N/A;   ? ?Family History  ?Problem Relation Age of Onset  ? Breast cancer Neg Hx   ? ?Review of Systems  ?Constitutional: Negative.   ?HENT: Negative.    ?Eyes: Negative.   ?Respiratory:  Negative for cough, chest tightness and shortness of breath.   ?Cardiovascular:  Negative for chest pain, palpitations and leg swelling.  ?Gastrointestinal:  Negative for abdominal distention, abdominal pain, constipation, diarrhea, nausea and vomiting.  ?Musculoskeletal: Negative.   ?Skin: Negative.   ?Neurological: Negative.   ?Psychiatric/Behavioral: Negative.    ? ?Objective:  ?Physical Exam ?Constitutional:   ?   Appearance: She is well-developed.  ?HENT:  ?   Head: Normocephalic and atraumatic.  ?Cardiovascular:  ?   Rate and Rhythm: Normal rate and regular rhythm.  ?Pulmonary:  ?   Effort: Pulmonary effort is normal. No respiratory distress.  ?   Breath sounds: Normal breath sounds. No wheezing or rales.  ?Abdominal:  ?   General: Bowel sounds are normal. There is no distension.  ?   Palpations: Abdomen is soft.  ?   Tenderness: There is no abdominal tenderness. There is no rebound.  ?Musculoskeletal:  ?   Cervical back: Normal range of motion.  ?Skin: ?   General: Skin is warm and dry.  ?Neurological:  ?   Mental Status: She is alert and oriented to person, place, and time.  ?   Coordination: Coordination normal.  ? ? ?Vitals:  ? 02/16/22 1101  ?BP: 118/80  ?Pulse: (!) 56  ?Resp: 18  ?SpO2: 100%  ?Weight: 162  lb 9.6 oz (73.8 kg)  ?Height: 5' 6.5" (1.689 m)  ? ?This visit occurred during the SARS-CoV-2 public health emergency.  Safety protocols were in place, including screening questions prior to the visit, additional usage of staff PPE, and extensive cleaning of exam room while observing appropriate contact time as indicated for disinfecting solutions.  ? ?Assessment & Plan:  ?Prevnar 20 given at visit ?

## 2022-02-21 ENCOUNTER — Encounter: Payer: Self-pay | Admitting: Endocrinology

## 2022-02-21 ENCOUNTER — Ambulatory Visit: Payer: Medicare Other | Admitting: Endocrinology

## 2022-02-21 VITALS — BP 124/90 | HR 78 | Ht 66.5 in | Wt 162.4 lb

## 2022-02-21 DIAGNOSIS — E230 Hypopituitarism: Secondary | ICD-10-CM

## 2022-02-21 LAB — T4, FREE: Free T4: 1.22 ng/dL (ref 0.60–1.60)

## 2022-02-21 LAB — TSH: TSH: 0.68 u[IU]/mL (ref 0.35–5.50)

## 2022-02-21 LAB — CORTISOL: Cortisol, Plasma: 5.2 ug/dL

## 2022-02-21 NOTE — Progress Notes (Signed)
? ?Subjective:  ? ? Patient ID: Rebecca Trevino, female    DOB: 1956/06/22, 66 y.o.   MRN: [id number] ? ?HPI ?Pt returns for f/u of pituitary insuff (she had resect of large pituitary adenoma in 2021, with poss apoplexy; f/u MRI, 2022: No residual tumor; Sella is enlarged. Probable small amount of pituitary is present to the right of midline). She will have f/u MRI in a few weeks.   ?He has these pituitary functions: ?ACTH: needs and takes prednisone.  ?Prolactin: normal ?FSH/LH: low ?TSH: needs and takes synthroid.   ?VP: normal labs.  ?Interval hx: she reports heat intol and anxiety.  She takes meds as rx'ed.   ?Past Medical History:  ?Diagnosis Date  ? High blood pressure   ? ? ?Past Surgical History:  ?Procedure Laterality Date  ? TRANSPHENOIDAL APPROACH EXPOSURE N/A 10/10/2020  ? Procedure: ENDONASAL ENDOSCOPIC TRANSPHENOIDAL TUMOR RESECTION;  Surgeon: Judith Part, MD;  Location: [facility];  Service: Neurosurgery;  Laterality: N/A;  ? ? ?Social History  ? ?Socioeconomic History  ? Marital status: Married  ?  Spouse name: Not on file  ? Number of children: Not on file  ? Years of education: Not on file  ? Highest education level: Not on file  ?Occupational History  ? Not on file  ?Tobacco Use  ? Smoking status: Never  ? Smokeless tobacco: Never  ?Substance and Sexual Activity  ? Alcohol use: Yes  ? Drug use: Never  ? Sexual activity: Not on file  ?Other Topics Concern  ? Not on file  ?Social History Narrative  ? Not on file  ? ?Social Determinants of Health  ? ?Financial Resource Strain: Not on file  ?Food Insecurity: Not on file  ?Transportation Needs: Not on file  ?Physical Activity: Not on file  ?Stress: Not on file  ?Social Connections: Not on file  ?Intimate Partner Violence: Not on file  ? ? ?Current Outpatient Medications on File Prior to Visit  ?Medication Sig Dispense Refill  ? albuterol (VENTOLIN HFA) 108 (90 Base) MCG/ACT inhaler Inhale 1-2 puffs into the lungs as needed for wheezing or shortness of  breath. 1 each 0  ? Calcium-Magnesium-Vitamin D (CALCIUM 1200+D3 PO)     ? hydrOXYzine (ATARAX/VISTARIL) 25 MG tablet TAKE 1 TABLET BY MOUTH AT  BEDTIME AS NEEDED FOR SLEEP 90 tablet 1  ? levothyroxine (SYNTHROID) 50 MCG tablet TAKE 1 TABLET BY MOUTH  DAILY 90 tablet 3  ? Magnesium 500 MG TABS Take 500 mg by mouth daily.    ? Multiple Vitamins-Minerals (MULTIVITAMIN WITH MINERALS) tablet Take 1 tablet by mouth daily.    ? omeprazole (PRILOSEC) 20 MG capsule TAKE 1 CAPSULE BY MOUTH  DAILY 90 capsule 3  ? ondansetron (ZOFRAN) 4 MG tablet Take 1 tablet (4 mg total) by mouth as needed for nausea or vomiting. 20 tablet 1  ? predniSONE (DELTASONE) 1 MG tablet TAKE 4 TABLETS BY MOUTH IN  THE MORNING 360 tablet 3  ? ?No current facility-administered medications on file prior to visit.  ? ? ?Allergies  ?Allergen Reactions  ? Codeine Nausea And Vomiting  ? Erythromycin Nausea And Vomiting  ? ? ?Family History  ?Problem Relation Age of Onset  ? Breast cancer Neg Hx   ? ? ?BP 124/90 (BP Location: Left Arm, Patient Position: Sitting, Cuff Size: Normal)   Pulse 78   Ht 5' 6.5" (1.689 m)   Wt 162 lb 6.4 oz (73.7 kg)   SpO2 97%   BMI  25.82 kg/m?  ? ? ?Review of Systems ? ?   ?Objective:  ? Physical Exam ?VITAL SIGNS:  See vs page ?GENERAL: no distress.   ? ?Lab Results  ?Component Value Date  ? TSH 0.68 02/21/2022  ?Free T4=1.2 ?   ?Assessment & Plan:  ?Central hypothyroidism: well-controlled.  Please continue the same synthroid. ?HPA insuff: recheck labs today. ? ? ?

## 2022-02-21 NOTE — Patient Instructions (Signed)
Blood tests are requested for you today.  We'll let you know about the results.   ?You should have an endocrinology follow-up appointment in 6 months.   ? ?

## 2022-02-23 DIAGNOSIS — H2513 Age-related nuclear cataract, bilateral: Secondary | ICD-10-CM | POA: Diagnosis not present

## 2022-02-23 DIAGNOSIS — H524 Presbyopia: Secondary | ICD-10-CM | POA: Diagnosis not present

## 2022-02-23 DIAGNOSIS — H4720 Unspecified optic atrophy: Secondary | ICD-10-CM | POA: Diagnosis not present

## 2022-02-23 DIAGNOSIS — H52223 Regular astigmatism, bilateral: Secondary | ICD-10-CM | POA: Diagnosis not present

## 2022-02-25 LAB — ACTH: C206 ACTH: <5 pg/mL — ABNORMAL LOW (ref 6–50)

## 2022-02-26 ENCOUNTER — Other Ambulatory Visit: Payer: Medicare Other

## 2022-03-05 ENCOUNTER — Encounter: Payer: Self-pay | Admitting: Dermatology

## 2022-03-05 NOTE — Progress Notes (Signed)
? ?  Follow-Up Visit ?  ?Subjective  ?Rebecca Trevino is a 66 y.o. female who presents for the following: Procedure (Patient here today for removal of possible cyst right eyelid). ? ?Cyst right upper eyelid which patient wants removed ?Location:  ?Duration:  ?Quality:  ?Associated Signs/Symptoms: ?Modifying Factors:  ?Severity:  ?Timing: ?Context:  ? ?Objective  ?Well appearing patient in no apparent distress; mood and affect are within normal limits. ?4 mm noninflamed epidermoid cyst right upper eyelid.  Patient requests removal.  Told preoperatively risks of recurrence, unsightly scar, infection.  She requested we proceed. ? ?Right Supraorbital Region ?.4 ? ? ? ?A focused examination was performed including head and neck. Relevant physical exam findings are noted in the Assessment and Plan. ? ? ?Assessment & Plan  ? ? ?Epidermoid cyst of eyelid, right ?Right Supraorbital Region ? ?3 mm elliptical excision, cyst dissected out and I chose to cauterize base rather than closed with sutures. ? ?Skin excision - Right Supraorbital Region ? ?Lesion length (cm):  0.4 ?Lesion width (cm):  0.3 ?Margin per side (cm):  0 ?Total excision diameter (cm):  0.4 ?Informed consent: discussed and consent obtained   ?Timeout: patient name, date of birth, surgical site, and procedure verified   ?Anesthesia: the lesion was anesthetized in a standard fashion   ?Anesthetic:  1% lidocaine w/ epinephrine 1-100,000 local infiltration ?Instrument used: #15 blade   ?Hemostasis achieved with: electrodesiccation   ?Outcome: patient tolerated procedure well with no complications   ?Post-procedure details: sterile dressing applied   ?Dressing type: petrolatum   ?Additional details:  Cyst was not deep so base was cauterized instead of sutured. ? ?Specimen 1 - Surgical pathology ?Differential Diagnosis: r/o cyst ? ?Check Margins: No ? ? ? ? ? ?I, Lavonna Monarch, MD, have reviewed all documentation for this visit.  The documentation on 03/05/22 for the  exam, diagnosis, procedures, and orders are all accurate and complete. ?

## 2022-03-09 ENCOUNTER — Ambulatory Visit
Admission: RE | Admit: 2022-03-09 | Discharge: 2022-03-09 | Disposition: A | Discharge status: Home / Self Care | Payer: Medicare Other | Source: Ambulatory Visit | Attending: Neurological Surgery | Admitting: Neurological Surgery

## 2022-03-09 DIAGNOSIS — D352 Benign neoplasm of pituitary gland: Secondary | ICD-10-CM

## 2022-03-09 DIAGNOSIS — Z86018 Personal history of other benign neoplasm: Secondary | ICD-10-CM | POA: Diagnosis not present

## 2022-03-09 MED ORDER — GADOBENATE DIMEGLUMINE 529 MG/ML IV SOLN
7.0000 mL | Freq: Once | INTRAVENOUS | Status: AC | PRN
  Administered 2022-03-09: 7 mL via INTRAVENOUS

## 2022-04-04 ENCOUNTER — Ambulatory Visit
Admission: RE | Admit: 2022-04-04 | Discharge: 2022-04-04 | Disposition: A | Discharge status: Home / Self Care | Payer: Medicare Other | Source: Ambulatory Visit | Attending: Internal Medicine | Admitting: Internal Medicine

## 2022-04-04 ENCOUNTER — Encounter: Payer: Self-pay | Admitting: Internal Medicine

## 2022-04-04 DIAGNOSIS — Z9189 Other specified personal risk factors, not elsewhere classified: Secondary | ICD-10-CM

## 2022-04-27 DIAGNOSIS — D352 Benign neoplasm of pituitary gland: Secondary | ICD-10-CM | POA: Diagnosis not present

## 2022-05-10 ENCOUNTER — Other Ambulatory Visit: Payer: Self-pay | Admitting: Internal Medicine

## 2022-05-15 ENCOUNTER — Encounter: Payer: Self-pay | Admitting: Internal Medicine

## 2022-05-22 ENCOUNTER — Encounter: Payer: Self-pay | Admitting: Internal Medicine

## 2022-06-25 ENCOUNTER — Encounter: Payer: Self-pay | Admitting: Internal Medicine

## 2022-06-26 ENCOUNTER — Other Ambulatory Visit: Payer: Self-pay | Admitting: Internal Medicine

## 2022-06-26 DIAGNOSIS — E230 Hypopituitarism: Secondary | ICD-10-CM

## 2022-07-13 ENCOUNTER — Encounter: Payer: Self-pay | Admitting: Internal Medicine

## 2022-08-15 ENCOUNTER — Other Ambulatory Visit (INDEPENDENT_AMBULATORY_CARE_PROVIDER_SITE_OTHER): Payer: Medicare Other

## 2022-08-15 DIAGNOSIS — E230 Hypopituitarism: Secondary | ICD-10-CM | POA: Diagnosis not present

## 2022-08-15 LAB — BASIC METABOLIC PANEL
BUN: 15 mg/dL (ref 6–23)
CO2: 32 mEq/L (ref 19–32)
Calcium: 9.4 mg/dL (ref 8.4–10.5)
Chloride: 105 mEq/L (ref 96–112)
Creatinine, Ser: 0.73 mg/dL (ref 0.40–1.20)
GFR: 84.46 mL/min (ref >60.00)
Glucose, Bld: 95 mg/dL (ref 70–99)
Potassium: 4.3 mEq/L (ref 3.5–5.1)
Sodium: 140 mEq/L (ref 135–145)

## 2022-08-15 LAB — TSH: TSH: 1.95 u[IU]/mL (ref 0.35–5.50)

## 2022-08-15 LAB — FOLLICLE STIMULATING HORMONE: FSH: 15.4 m[IU]/mL

## 2022-08-15 LAB — T4, FREE: Free T4: 0.97 ng/dL (ref 0.60–1.60)

## 2022-08-20 ENCOUNTER — Ambulatory Visit: Payer: Medicare Other | Admitting: Internal Medicine

## 2022-08-20 ENCOUNTER — Encounter: Payer: Self-pay | Admitting: Internal Medicine

## 2022-08-20 VITALS — BP 128/90 | HR 85 | Ht 66.6 in | Wt 155.6 lb

## 2022-08-20 DIAGNOSIS — E2749 Other adrenocortical insufficiency: Secondary | ICD-10-CM

## 2022-08-20 DIAGNOSIS — E893 Postprocedural hypopituitarism: Secondary | ICD-10-CM

## 2022-08-20 DIAGNOSIS — E038 Other specified hypothyroidism: Secondary | ICD-10-CM | POA: Diagnosis not present

## 2022-08-20 MED ORDER — HYDROCORTISONE 5 MG PO TABS: ORAL_TABLET | ORAL | 3 refills | Status: DC

## 2022-08-20 NOTE — Patient Instructions (Addendum)
Stop Prednisone    Start Hydrocortisone 5 mg, TWO tablets with Breakfast  and 1 tablet between 2-4 pm    Let me know if you notice extreme fatigue, dizziness or consistent nausea    Three months from now you will return for a fasting 8 AM labs ( the day prior to the test : take the Hydrocortisone 2 tablets with Breakfast but do NOT take the afternoon hydrocortisone dose ) on the day  of the test do NOT take Hydrocortisone THAT morning but bring it with you , so you can take it after the test is done )      ADRENAL INSUFFICIENCY SICK DAY RULES:  Should you face an extreme emotional or physical stress such as trauma, surgery or acute illness, this will require extra steroid coverage so that the body can meet that stress.   Without increasing the steroid dose you may experience severe weakness, headache, dizziness, nausea and vomiting and possibly a more serious deterioration in health.  Typically the dose of steroids will only need to be increased for a couple of days if you have an illness that is transient and managed in the community.   If you are unable to take/absorb an increased dose of steroids orally because of vomiting or diarrhea, you will urgently require steroid injections and should present to an Emergency Department.  The general advice for any serious illness is as follows: Double the normal daily steroid dose for up to 3 days if you have a temperature of more than 37.50C (99.82F) with signs of sickness, or severe emotional or physical distress Contact your primary care doctor and Endocrinologist if the illness worsens or it lasts for more than 3 days.  In cases of severe illness, urgent medical assistance should be promptly sought.

## 2022-08-20 NOTE — Progress Notes (Signed)
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 She has followed with [facility] since 11/24/2020 for consultative assistance with management of her hypopituitarism.   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 Post-op MRI showed enlarged sella with right residual pituitary noted on MRI in 11/2020 and 02/2022   Preoperatively the patient had a low ACTH at 4.6 PG/mL (7.2-63.3) with a cortisol level of 1.4 UG/DL, LH and FSH inappropriately normal at 0.9 and 7.7 m IU/mL respectively.  Prolactin normal at 20 ng/mL , no IGF-1 but was noted with low somatomedin C.  She was also noted to have a normal TSH at 0.488 u IU/mL with low free T4 at 0.55 ng/dL   She also had hyponatremia 115 mEq but this responded well with fluid restriction        SUBJECTIVE:    Today (08/20/2022):  Rebecca Trevino is here for a follow up on hypopituitarism. She is accompanied buy her spouse Saralyn Pilar .    Weight has been trending down  Nocturia 1- 2x  Denies polydipsia  Denies nausea or dizziness unless has sinus issues  Denies vision changes and no headaches  She is on Biotin  Denies constipation or diarrhea     Levothyroxine 50 mcg daily  Prednisone 1 mg , 4 tabs daily       HISTORY:  Past Medical History:  Past Medical History:  Diagnosis Date   High blood pressure    Past Surgical History:  Past Surgical History:  Procedure Laterality Date   TRANSPHENOIDAL APPROACH EXPOSURE N/A 10/10/2020   Procedure: ENDONASAL ENDOSCOPIC TRANSPHENOIDAL TUMOR RESECTION;  Surgeon: Judith Part, MD;  Location:  [facility];  Service: Neurosurgery;  Laterality: N/A;   Social History:  reports that she has never smoked. She has never used smokeless tobacco. She reports current alcohol use. She reports that she does not use drugs. Family History:  Family History  Problem Relation Age of Onset   Breast cancer Neg Hx      HOME MEDICATIONS: Allergies as of 08/20/2022       Reactions   Codeine Nausea And Vomiting   Erythromycin Nausea And Vomiting        Medication List        Accurate as of August 20, 2022 12:27 PM. If you have any questions, ask your nurse or doctor.          albuterol 108 (90 Base) MCG/ACT inhaler Commonly known as: VENTOLIN HFA Inhale 1-2 puffs into the lungs as needed for wheezing or shortness of breath.   CALCIUM 1200+D3 PO   hydrOXYzine 25 MG tablet Commonly known as: ATARAX TAKE 1 TABLET BY MOUTH AT  BEDTIME AS NEEDED FOR SLEEP   levothyroxine 50 MCG tablet Commonly known as: SYNTHROID TAKE 1 TABLET BY MOUTH  DAILY   Magnesium 500 MG Tabs Take 500 mg by mouth daily.   multivitamin with minerals tablet Take 1 tablet by mouth daily.   omeprazole 20 MG capsule Commonly known as: PRILOSEC TAKE 1 CAPSULE BY MOUTH  DAILY   ondansetron 4 MG tablet  Commonly known as: ZOFRAN Take 1 tablet (4 mg total) by mouth as needed for nausea or vomiting.   predniSONE 1 MG tablet Commonly known as: DELTASONE TAKE 4 TABLETS BY MOUTH IN  THE MORNING          OBJECTIVE:   PHYSICAL EXAM: VS: BP (!) 128/90 (BP Location: Left Arm, Patient Position: Sitting, Cuff Size: Normal)   Pulse 85   Ht 5' 6.6" (1.692 m)   Wt 155 lb 9.6 oz (70.6 kg)   SpO2 97%   BMI 24.66 kg/m    Body surface area is 1.82 meters squared.   EXAM: General: Pt appears well and is in NAD  Eyes: External eye exam normal without stare, lid lag or exophthalmos.  EOM intact.    Neck: General: Supple without adenopathy. Thyroid: Thyroid size normal.  No goiter or nodules appreciated. No  thyroid bruit.  Lungs: Clear with good BS bilat with no rales, rhonchi, or wheezes  Heart: Auscultation: RRR.  Abdomen: Normoactive bowel sounds, soft, nontender, without masses or organomegaly palpable  Extremities:  BL LE: No pretibial edema normal ROM and strength.  Mental Status: Judgment, insight: Intact Orientation: Oriented to time, place, and person Mood and affect: No depression, anxiety, or agitation     DATA REVIEWED:  Latest Reference Range & Units 08/15/22 11:11  C206 ACTH 6 - 50 pg/mL 13  FSH mIU/ML 15.4  Prolactin ng/mL 6.6  Glucose 70 - 99 mg/dL 95  TSH 0.35 - 5.50 uIU/mL 1.95  T4,Free(Direct) 0.60 - 1.60 ng/dL 0.97     Latest Reference Range & Units 08/15/22 11:11  Sodium 135 - 145 mEq/L 140  Potassium 3.5 - 5.1 mEq/L 4.3  Chloride 96 - 112 mEq/L 105  CO2 19 - 32 mEq/L 32  Glucose 70 - 99 mg/dL 95  BUN 6 - 23 mg/dL 15  Creatinine 0.40 - 1.20 mg/dL 0.73  Calcium 8.4 - 10.5 mg/dL 9.4  GFR >60.00 mL/min 84.46   MRI 03/09/2022  FINDINGS: Brain: Prior trans-sphenoidal resection of pituitary tumor. The sella remains enlarged and unchanged. No recurrent tumor. Infundibulum mildly deviated to the right unchanged. Small amount of enhancing tissue in the right sella may represent residual pituitary which is unchanged. Optic chiasm normal. Cavernous sinus normal bilaterally.   Ventricle size normal. No acute infarct or hemorrhage. Mild chronic microvascular ischemic changes in the white matter are stable.   Vascular: Normal arterial flow voids at the skull base   Skull and upper cervical spine: No focal abnormality.   Sinuses/Orbits: Mucosal edema in the sphenoid sinus due to prior trans-sphenoidal surgery.   Other: None   IMPRESSION: Stable MRI of the brain and pituitary. Postop changes of pituitary resection. No recurrent tumor.   Mild chronic microvascular ischemic change in the white matter stable.     Old records , labs and images have been  reviewed.    ASSESSMENT / PLAN / RECOMMENDATIONS:   S/P Transphenoidal  hypophysectomy:   -Her surgery was December 2021 due to pituitary apoplexy -She had required levothyroxine and hydrocortisone prior to the surgery -MRI May 2023 showed stable postop pituitary changes -No local symptoms   2. Secondary Adrenal Insufficiency   -Her ACTH has improved from <5 PG/mL to 13 PG/mL which is reassuring -I have recommended switching her from prednisone to hydrocortisone in an attempt to see if we can wean her off the hydrocortisone -She was advised that she will return to the office in 3 months with the understanding that she  will hold the hydrocortisone the prior night as well as the day of the test and we will check a cortisol level to determine if she will need to continue on hydrocortisone versus discontinue -We discussed the sick day rules    Medications  Stop prednisone Start hydrocortisone 5 mg, 2 tabs with breakfast and 1 tab between 2-4 PM    3. Secondary Hypothyroidism:  -Patient is clinically euthyroid -TFTs normal -No change   Medication Continue levothyroxine 50 mcg daily  Follow-up in 6 months   Signed electronically by: Mack Guise, MD  [facility]  [address] [place], [place] [address] Phone: [phone] FAX: [phone]      CC: Hoyt Koch, [place] [address] Phone: [phone]  Fax: [phone]   Return to Endocrinology clinic as below: Future Appointments  Date Time Provider [place]  02/22/2023 11:00 AM Hoyt Koch, MD [facility] None

## 2022-08-21 DIAGNOSIS — E893 Postprocedural hypopituitarism: Secondary | ICD-10-CM | POA: Insufficient documentation

## 2022-08-21 DIAGNOSIS — E038 Other specified hypothyroidism: Secondary | ICD-10-CM | POA: Insufficient documentation

## 2022-08-21 DIAGNOSIS — E2749 Other adrenocortical insufficiency: Secondary | ICD-10-CM | POA: Insufficient documentation

## 2022-08-21 MED ORDER — LEVOTHYROXINE SODIUM 50 MCG PO TABS: 50.0000 ug | ORAL_TABLET | Freq: Every day | ORAL | 3 refills | Status: DC

## 2022-08-22 LAB — PROLACTIN: Prolactin: 6.6 ng/mL

## 2022-08-22 LAB — INSULIN-LIKE GROWTH FACTOR
IGF-I, LC/MS: 64 ng/mL (ref 41–279)
Z-Score (Female): -1.1 SD (ref ?–2.0)

## 2022-08-22 LAB — ACTH: C206 ACTH: 13 pg/mL (ref 6–50)

## 2022-09-07 ENCOUNTER — Other Ambulatory Visit: Payer: Self-pay | Admitting: Internal Medicine

## 2022-09-26 DIAGNOSIS — E236 Other disorders of pituitary gland: Secondary | ICD-10-CM | POA: Diagnosis not present

## 2022-11-08 ENCOUNTER — Encounter: Payer: Self-pay | Admitting: Internal Medicine

## 2022-11-09 MED ORDER — FAMOTIDINE 20 MG PO TABS: 20.0000 mg | ORAL_TABLET | Freq: Every day | ORAL | 3 refills | Status: DC

## 2022-11-21 ENCOUNTER — Other Ambulatory Visit (INDEPENDENT_AMBULATORY_CARE_PROVIDER_SITE_OTHER): Payer: Medicare Other

## 2022-11-21 DIAGNOSIS — E2749 Other adrenocortical insufficiency: Secondary | ICD-10-CM | POA: Diagnosis not present

## 2022-11-21 LAB — CORTISOL: Cortisol, Plasma: 8.8 ug/dL

## 2022-11-22 ENCOUNTER — Encounter: Payer: Self-pay | Admitting: Internal Medicine

## 2022-11-25 ENCOUNTER — Encounter: Payer: Self-pay | Admitting: Internal Medicine

## 2022-11-25 DIAGNOSIS — E2749 Other adrenocortical insufficiency: Secondary | ICD-10-CM

## 2022-11-25 LAB — ACTH: C206 ACTH: 17 pg/mL (ref 6–50)

## 2022-12-21 ENCOUNTER — Other Ambulatory Visit: Payer: Self-pay | Admitting: Internal Medicine

## 2022-12-24 IMAGING — MR MR HEAD WO/W CM
12 of 20 series · 34 of 48 positions shown · IV contrast (multihance)
Comparison: MRI head 10/08/2020

CLINICAL DATA: History of pituitary removal October 18, 2020.

EXAM:
MRI HEAD WITHOUT AND WITH CONTRAST
TECHNIQUE: Multiplanar, multiecho pulse sequences of the brain and surrounding
structures were obtained without and with intravenous contrast.
CONTRAST:  8mL MULTIHANCE GADOBENATE DIMEGLUMINE 529 MG/ML IV SOLN

[Series 2: t1_se_sag · sagittal · 5.0mm · 0.45mm/px · 3 of 19 slices shown]
[im 1/19]
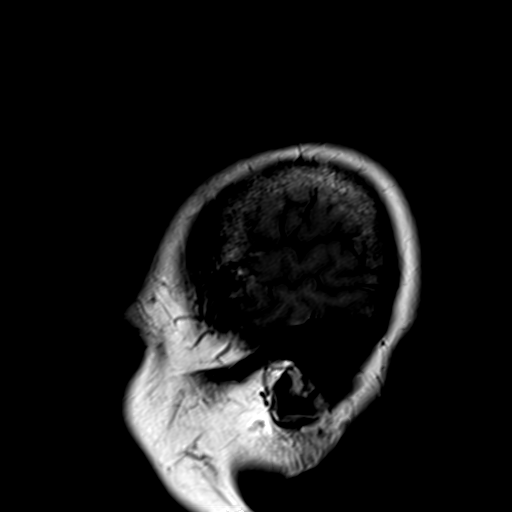
[im 10/19]
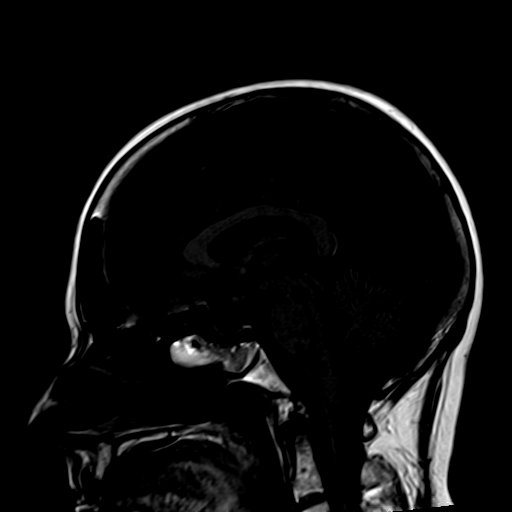
[im 19/19]
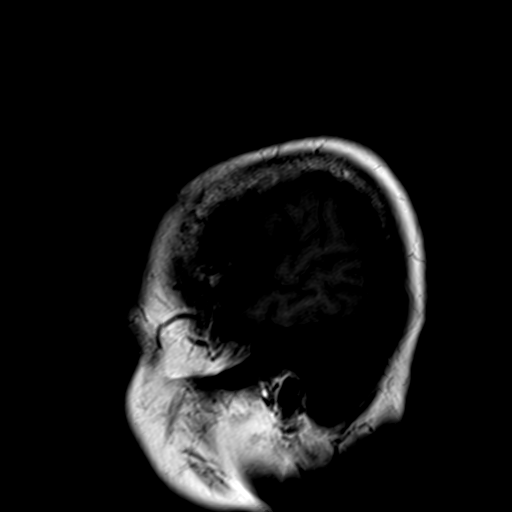

[Series 3: ep2d_diff_3 · axial · 3.0mm · 1.80mm/px · z∈[-132,+15]mm · 8 of 99 slices shown]
[im 1/99]
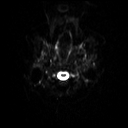
[im 11/99]
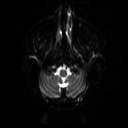
[im 33/99]
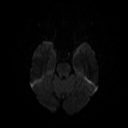
[im 44/99]
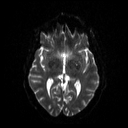
[im 55/99]
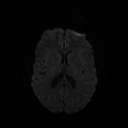
[im 66/99]
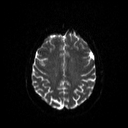
[im 88/99]
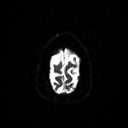
[im 99/99]
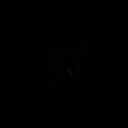

[Series 4: ep2d_diff_3_adc · axial · 3.0mm · 1.80mm/px · z∈[-132,+15]mm · 5 of 50 slices shown]
[im 1/50]
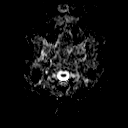
[im 13/50]
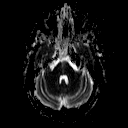
[im 25/50]
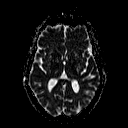
[im 37/50]
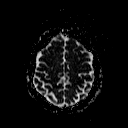
[im 50/50]
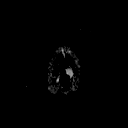

[Series 5: T2 · axial · 5.0mm · 0.45mm/px · z∈[-126,+10]mm · 2 of 22 slices shown (1 of 2)]
[im 1/22]
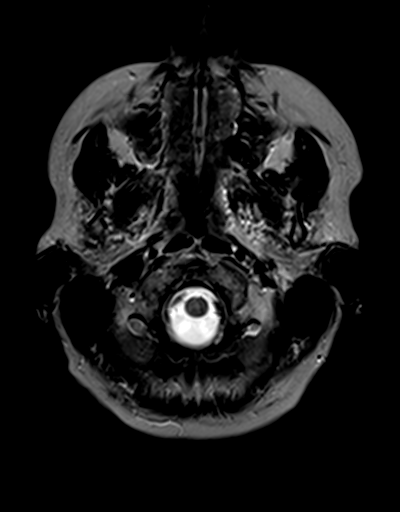
[im 22/22]
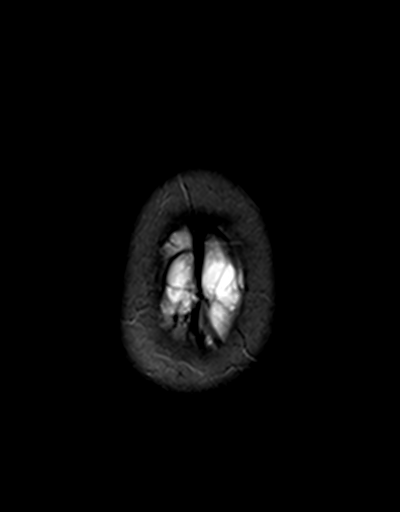

[Series 6: GRE · axial · 5.0mm · 0.45mm/px · z∈[-126,+10]mm · 2 of 22 slices shown]
[im 1/22]
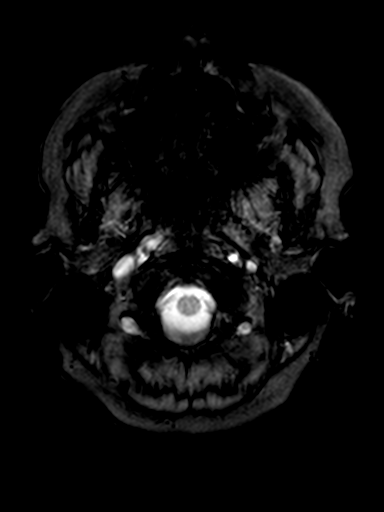
[im 22/22]
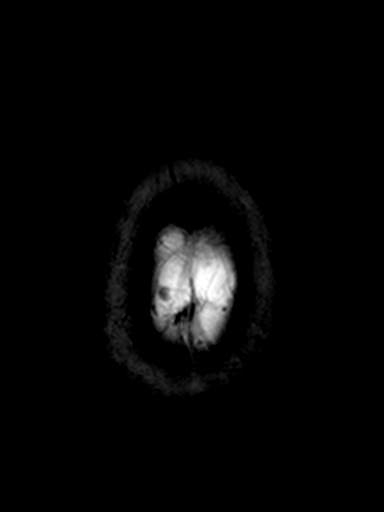

[Series 7: FLAIR · axial · 5.0mm · 0.45mm/px · z∈[-126,+10]mm · 2 of 22 slices shown]
[im 1/22]
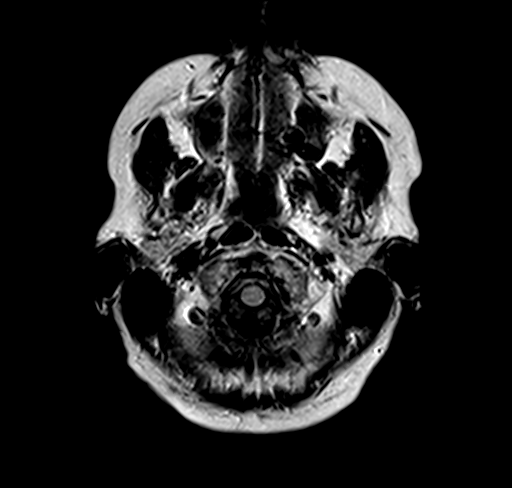
[im 22/22]
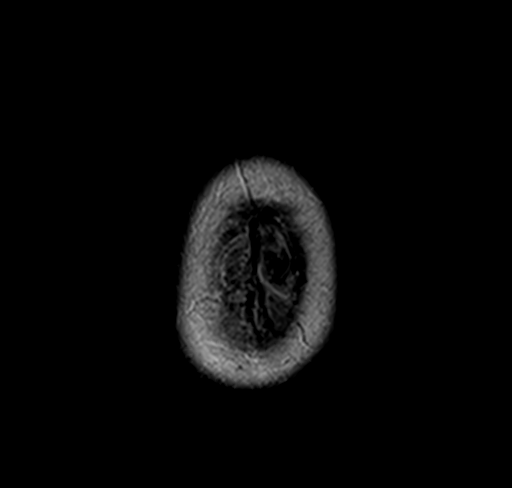

[Series 8: T1 · sagittal · 3.0mm · 0.35mm/px · 1 of 11 slices shown (1 of 2)]
[im 1/11]
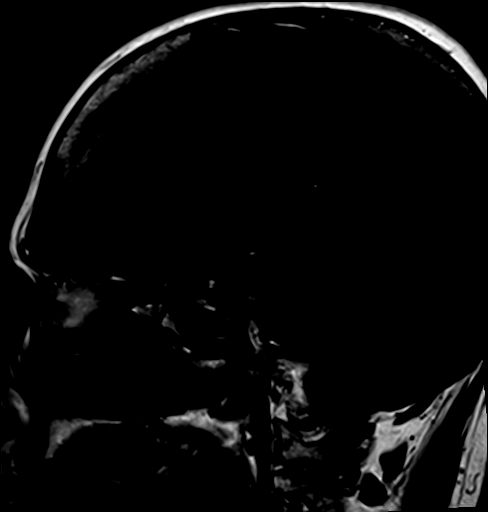

[Series 9: T1 · coronal · 3.0mm · 0.35mm/px · 1 of 12 slices shown (2 of 2)]
[im 1/12]
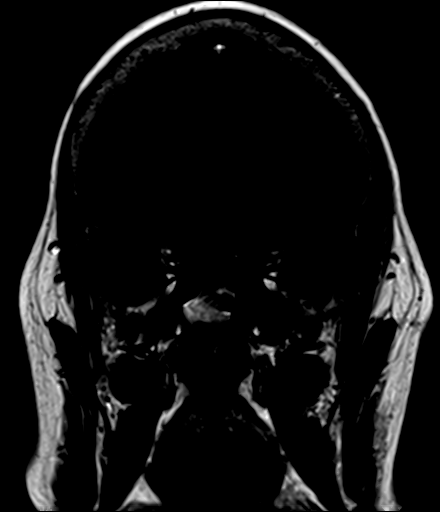

[Series 10: T2 · coronal · 3.0mm · 0.40mm/px · 1 of 12 slices shown (2 of 2)]
[im 1/12]
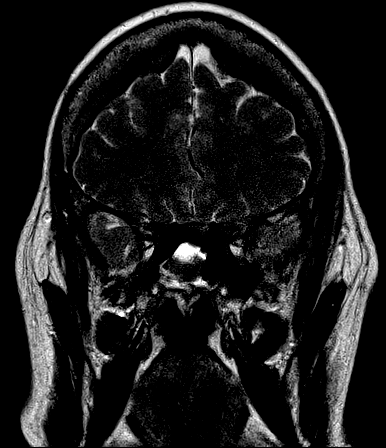

[Series 18: T1 post-contrast · coronal · 3.0mm · 0.35mm/px · 1 of 12 slices shown (1 of 3)]
[im 1/12]
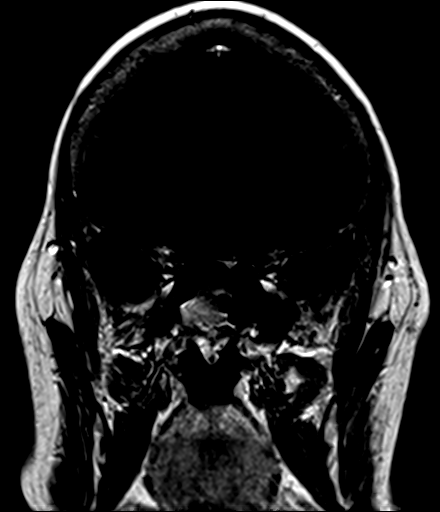

[Series 19: T1 post-contrast · sagittal · 3.0mm · 0.35mm/px · 1 of 11 slices shown (2 of 3)]
[im 1/11]
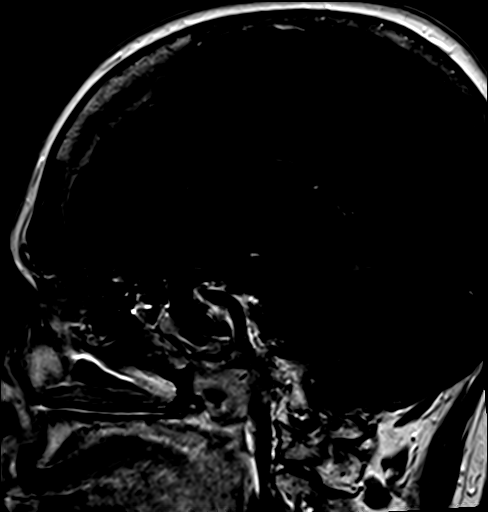

[Series 21: T1 post-contrast · axial · 2.0mm · 0.45mm/px · z∈[-129,+13]mm · 7 of 72 slices shown (3 of 3)]
[im 1/72]
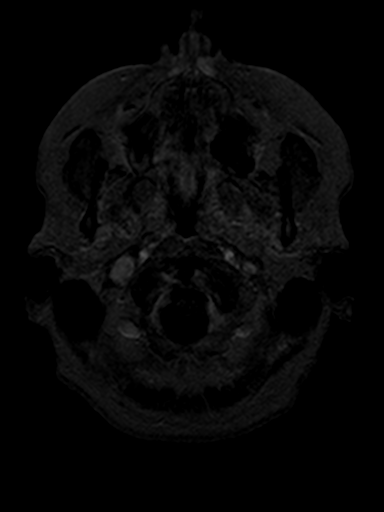
[im 12/72]
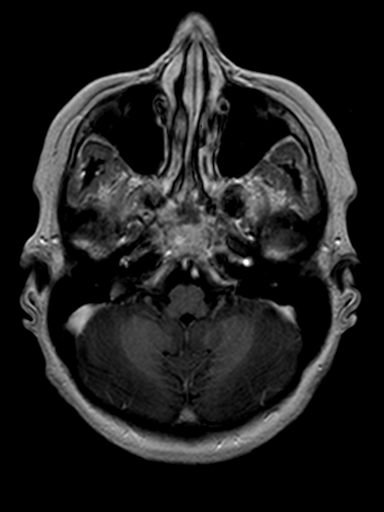
[im 24/72]
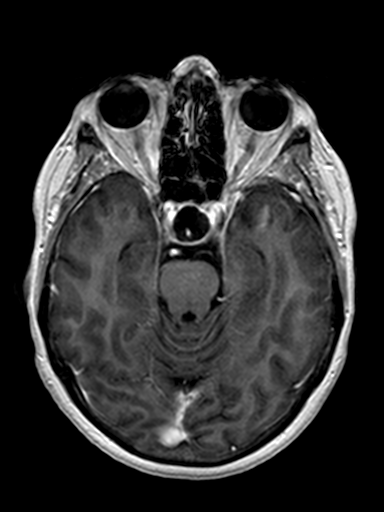
[im 36/72]
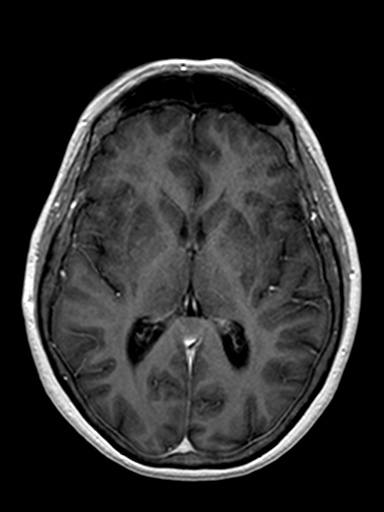
[im 48/72]
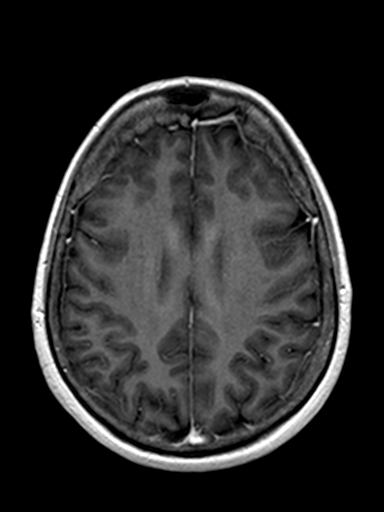
[im 60/72]
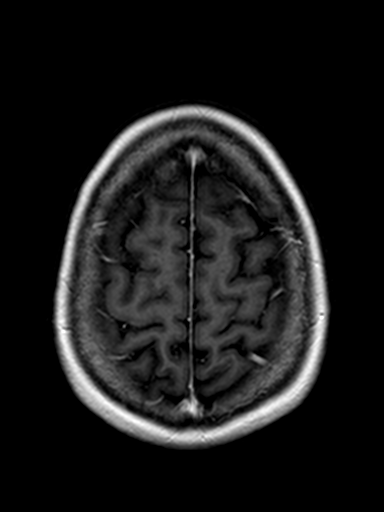
[im 72/72]
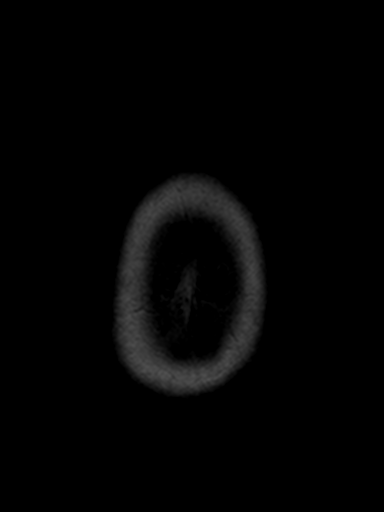

[34 of 48 positions shown; findings below may reference images not displayed]

FINDINGS: Brain: Interval trans-sphenoidal resection of large pituitary mass.
No residual tumor identified. The sella is enlarged and filled with
CSF. The infundibulum is now identified and is slightly deviated to
the right. There may be a small amount of enhancing pituitary tissue
in the right of the sella. Optic chiasm normal. Cavernous sinus
normal bilaterally.

Ventricle size normal. Mild patchy white matter hyperintensity
bilaterally unchanged most consistent with chronic microvascular
ischemia. No acute infarct. No intracranial hemorrhage or fluid
collection.

Vascular: Normal arterial flow voids.

Skull and upper cervical spine: No focal skeletal lesion.

Sinuses/Orbits: Mucosal edema throughout the sphenoid sinus. Normal
orbit.

Other: None
IMPRESSION: Gross total resection of pituitary macro adenoma. No residual tumor.
Sella is enlarged. Probable small amount of pituitary is present to
the right of midline.

Mild chronic microvascular ischemic change in the white matter.

## 2023-01-09 ENCOUNTER — Ambulatory Visit: Payer: Medicare Other | Admitting: Dermatology

## 2023-01-20 ENCOUNTER — Encounter: Payer: Self-pay | Admitting: Internal Medicine

## 2023-01-29 ENCOUNTER — Encounter: Payer: Self-pay | Admitting: Internal Medicine

## 2023-02-14 ENCOUNTER — Encounter: Payer: Self-pay | Admitting: Internal Medicine

## 2023-02-15 ENCOUNTER — Other Ambulatory Visit: Payer: Self-pay | Admitting: Family Medicine

## 2023-02-15 ENCOUNTER — Encounter: Payer: Self-pay | Admitting: Internal Medicine

## 2023-02-15 DIAGNOSIS — L659 Nonscarring hair loss, unspecified: Secondary | ICD-10-CM | POA: Diagnosis not present

## 2023-02-15 MED ORDER — ALBUTEROL SULFATE HFA 108 (90 BASE) MCG/ACT IN AERS: 1.0000 | INHALATION_SPRAY | RESPIRATORY_TRACT | 0 refills | Status: DC | PRN

## 2023-02-18 ENCOUNTER — Other Ambulatory Visit: Payer: Self-pay

## 2023-02-18 ENCOUNTER — Telehealth: Payer: Self-pay | Admitting: Internal Medicine

## 2023-02-18 MED ORDER — ALBUTEROL SULFATE HFA 108 (90 BASE) MCG/ACT IN AERS: 1.0000 | INHALATION_SPRAY | RESPIRATORY_TRACT | 0 refills | Status: DC | PRN

## 2023-02-18 NOTE — Telephone Encounter (Signed)
Judeth Cornfield from [facility] called needing clarification on frequency for  albuterol (VENTOLIN HFA) 108 (90 Base) MCG/ACT inhaler  Please return call at [phone]

## 2023-02-18 NOTE — Telephone Encounter (Signed)
Called [facility] spoke w/Stephanie gave frequency of albuterol...Raechel Chute

## 2023-02-20 ENCOUNTER — Ambulatory Visit: Payer: Medicare Other | Admitting: Internal Medicine

## 2023-02-20 ENCOUNTER — Encounter: Payer: Self-pay | Admitting: Internal Medicine

## 2023-02-20 VITALS — BP 122/70 | HR 55 | Ht 66.6 in | Wt 151.0 lb

## 2023-02-20 DIAGNOSIS — E2749 Other adrenocortical insufficiency: Secondary | ICD-10-CM

## 2023-02-20 DIAGNOSIS — E038 Other specified hypothyroidism: Secondary | ICD-10-CM | POA: Diagnosis not present

## 2023-02-20 DIAGNOSIS — E893 Postprocedural hypopituitarism: Secondary | ICD-10-CM | POA: Diagnosis not present

## 2023-02-20 LAB — COMPREHENSIVE METABOLIC PANEL
ALT: 26 U/L (ref 0–35)
AST: 25 U/L (ref 0–37)
Albumin: 4.5 g/dL (ref 3.5–5.2)
Alkaline Phosphatase: 97 U/L (ref 39–117)
BUN: 19 mg/dL (ref 6–23)
CO2: 28 mEq/L (ref 19–32)
Calcium: 10 mg/dL (ref 8.4–10.5)
Chloride: 103 mEq/L (ref 96–112)
Creatinine, Ser: 0.83 mg/dL (ref 0.40–1.20)
GFR: 72.14 mL/min (ref >60.00)
Glucose, Bld: 86 mg/dL (ref 70–99)
Potassium: 4 mEq/L (ref 3.5–5.1)
Sodium: 140 mEq/L (ref 135–145)
Total Bilirubin: 0.5 mg/dL (ref 0.2–1.2)
Total Protein: 7.9 g/dL (ref 6.0–8.3)

## 2023-02-20 LAB — TSH: TSH: 1.45 u[IU]/mL (ref 0.35–5.50)

## 2023-02-20 LAB — CORTISOL
Cortisol, Plasma: 10.6 ug/dL
Cortisol, Plasma: 19.7 ug/dL
Cortisol, Plasma: 20.3 ug/dL

## 2023-02-20 LAB — T4, FREE: Free T4: 0.93 ng/dL (ref 0.60–1.60)

## 2023-02-20 MED ORDER — LEVOTHYROXINE SODIUM 50 MCG PO TABS: 50.0000 ug | ORAL_TABLET | Freq: Every day | ORAL | 3 refills | Status: DC

## 2023-02-20 MED ORDER — COSYNTROPIN 0.25 MG IJ SOLR
0.2500 mg | Freq: Once | INTRAMUSCULAR | Status: AC
Start: 2023-02-20 — End: 2023-02-20
  Administered 2023-02-20: 0.25 mg via INTRAVENOUS

## 2023-02-20 NOTE — Progress Notes (Signed)
After obtaining consent, and per orders of Dr. Shamleffer , injection of Cosyntropin 1ml given by Larenzo Caples L Kilyn Maragh in left arm . Patient instructed to remain in clinic for 20 minutes afterwards, and to report any adverse reaction to me immediately.  

## 2023-02-20 NOTE — Progress Notes (Signed)
Name: Rebecca Trevino  MRN/ DOB: [id number], 1956-09-16    Age/ Sex: 67 y.o., female     PCP: Myrlene Broker, MD   Reason for Endocrinology Evaluation: hypopituitarism     Initial Endocrinology Clinic Visit: 11/24/2020    PATIENT IDENTIFIER: Rebecca Trevino is a 67 y.o., female with a past medical history of S/P pituitary resection and GERD. She has followed with [facility] since 11/24/2020 for consultative assistance with management of her hypopituitarism.   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 Post-op MRI showed enlarged sella with right residual pituitary noted on MRI in 11/2020 and 02/2022   Preoperatively the patient had a low ACTH at 4.6 PG/mL (7.2-63.3) with a cortisol level of 1.4 UG/DL, LH and FSH inappropriately normal at 0.9 and 7.7 m IU/mL respectively.  Prolactin normal at 20 ng/mL , no IGF-1 but was noted with low somatomedin C.  She was also noted to have a normal TSH at 0.488 u IU/mL with low free T4 at 0.55 ng/dL   She also had hyponatremia 115 mEq but this responded well with fluid restriction    By 07/2022 her ACTH has normalized, we switched from prednisone to hydrocortisone  By 10/2022 ACTH continue to improve at  17 PG/mL, fasting cortisol with holding hydrocortisone prior night was 8.8 UG/DL.  Patient advised to wean off hydrocortisone which was discontinued around February 2024   Cosyntropin stimulation test was normal 01/2023 with 30-minute cortisol 20.3 UG/dL    SUBJECTIVE:    Today (02/20/2023):  Rebecca Trevino is here for a follow up on hypopituitarism.   She has been off Hydrocortisone since 11/2022 Denies dizziness  She has noted weight loss  Denies nausea, denies constipation She has noted increase acne  Denies polydipsia  Nocturia ~ 1x  Denies vision changes and no headaches  Held Biotin    Levothyroxine 50 mcg daily        HISTORY:   Past Medical History:  Past Medical History:  Diagnosis Date   High blood pressure    Past Surgical History:  Past Surgical History:  Procedure Laterality Date   TRANSPHENOIDAL APPROACH EXPOSURE N/A 10/10/2020   Procedure: ENDONASAL ENDOSCOPIC TRANSPHENOIDAL TUMOR RESECTION;  Surgeon: Jadene Pierini, MD;  Location: [facility];  Service: Neurosurgery;  Laterality: N/A;   Social History:  reports that she has never smoked. She has never used smokeless tobacco. She reports current alcohol use. She reports that she does not use drugs. Family History:  Family History  Problem Relation Age of Onset   Breast cancer Neg Hx      HOME MEDICATIONS: Allergies as of 02/20/2023       Reactions   Codeine Nausea And Vomiting   Erythromycin Nausea And Vomiting        Medication List        Accurate as of February 20, 2023  4:05 PM. If you have any questions, ask your nurse or doctor.          albuterol 108 (90 Base) MCG/ACT inhaler Commonly known as: VENTOLIN HFA Inhale 1-2 puffs into the lungs as needed for wheezing or shortness of breath.   CALCIUM 1200+D3 PO   famotidine 20 MG tablet Commonly known as: Pepcid Take 1 tablet (20 mg total) by mouth daily.   hydrocortisone 5 MG tablet Commonly known as: CORTEF Take 2 tablets (10 mg total) by mouth daily with breakfast  AND 1 tablet (5 mg total) daily with lunch.   hydrOXYzine 25 MG tablet Commonly known as: ATARAX TAKE 1 TABLET BY MOUTH AT  BEDTIME AS NEEDED FOR SLEEP   levothyroxine 50 MCG tablet Commonly known as: SYNTHROID Take 1 tablet (50 mcg total) by mouth daily.   Magnesium 500 MG Tabs Take 500 mg by mouth daily.   multivitamin with minerals tablet Take 1 tablet by mouth daily.   ondansetron 4 MG tablet Commonly known as: ZOFRAN Take 1 tablet (4 mg total) by mouth as needed for nausea or vomiting.          OBJECTIVE:   PHYSICAL EXAM: VS: BP 122/70 (BP Location: Left Arm, Patient Position: Sitting,  Cuff Size: Small)   Pulse (!) 55   Ht 5' 6.6" (1.692 m)   Wt 151 lb (68.5 kg)   SpO2 94%   BMI 23.93 kg/m    Body surface area is 1.79 meters squared.   EXAM: General: Pt appears well and is in NAD  Neck: General: Supple without adenopathy. Thyroid: Thyroid size normal.  No goiter or nodules appreciated.  Lungs: Clear with good BS bilat   Heart: Auscultation: RRR.  Abdomen: soft, nontender  Extremities:  BL LE: No pretibial edema  Mental Status: Judgment, insight: Intact Orientation: Oriented to time, place, and person Mood and affect: No depression, anxiety, or agitation     DATA REVIEWED:   Latest Reference Range & Units 02/20/23 10:55  TSH 0.35 - 5.50 uIU/mL 1.45  T4,Free(Direct) 0.60 - 1.60 ng/dL 1.61    Latest Reference Range & Units 02/20/23 10:55 02/20/23 11:35 02/20/23 11:56  Cortisol, Plasma ug/dL 09.6 04.5 40.9   MRI 06/08/9146  FINDINGS: Brain: Prior trans-sphenoidal resection of pituitary tumor. The sella remains enlarged and unchanged. No recurrent tumor. Infundibulum mildly deviated to the right unchanged. Small amount of enhancing tissue in the right sella may represent residual pituitary which is unchanged. Optic chiasm normal. Cavernous sinus normal bilaterally.   Ventricle size normal. No acute infarct or hemorrhage. Mild chronic microvascular ischemic changes in the white matter are stable.   Vascular: Normal arterial flow voids at the skull base   Skull and upper cervical spine: No focal abnormality.   Sinuses/Orbits: Mucosal edema in the sphenoid sinus due to prior trans-sphenoidal surgery.   Other: None   IMPRESSION: Stable MRI of the brain and pituitary. Postop changes of pituitary resection. No recurrent tumor.   Mild chronic microvascular ischemic change in the white matter stable.    ASSESSMENT / PLAN / RECOMMENDATIONS:   S/P Transphenoidal  hypophysectomy:   -Her surgery was December 2021 due to pituitary apoplexy -She  had required levothyroxine and hydrocortisone prior to the surgery -MRI May 2023 showed stable postop pituitary changes -No local symptoms   2. Secondary Adrenal Insufficiency   -Her ACTH has improved from <5 PG/mL to 17 PG/mL by 10/2022  -Hydrocortisone discontinued 11/2022 -Cosyntropin stimulation test was done today which came back normal -Patient does NOT need any more hydrocortisone    3. Secondary Hypothyroidism:  -Patient is clinically euthyroid -TFTs normal -No change   Medication Continue levothyroxine 50 mcg daily  Follow-up in 6 months   Signed electronically by: Lyndle Herrlich, MD  [facility]  [facility] [address] [place] [place], [place] [address] Phone: [phone] FAX: [phone]      CC: Myrlene Broker, MD [address] [place] [address] Phone: [phone]  Fax: [phone]   Return to  Endocrinology clinic as below: Future Appointments  Date Time Provider Department Center  02/25/2023 11:00 AM Vivi Barrack, DPM [facility]  02/27/2023 10:00 AM [facility] None  03/01/2023 11:00 AM Myrlene Broker, MD [facility] None  08/21/2023 10:10 AM Mounir Skipper, Konrad Dolores, MD [facility] None

## 2023-02-20 NOTE — Patient Instructions (Signed)

## 2023-02-21 ENCOUNTER — Telehealth: Payer: Self-pay

## 2023-02-21 NOTE — Telephone Encounter (Signed)
Contacted Rebecca Trevino to schedule their annual wellness visit. Appointment made for 02/28/23.  Agnes Lawrence, CMA (AAMA)  [facility] [phone]

## 2023-02-22 ENCOUNTER — Encounter: Payer: Medicare Other | Admitting: Internal Medicine

## 2023-02-22 DIAGNOSIS — H524 Presbyopia: Secondary | ICD-10-CM | POA: Diagnosis not present

## 2023-02-22 DIAGNOSIS — H2513 Age-related nuclear cataract, bilateral: Secondary | ICD-10-CM | POA: Diagnosis not present

## 2023-02-22 DIAGNOSIS — H4720 Unspecified optic atrophy: Secondary | ICD-10-CM | POA: Diagnosis not present

## 2023-02-22 DIAGNOSIS — H52222 Regular astigmatism, left eye: Secondary | ICD-10-CM | POA: Diagnosis not present

## 2023-02-25 ENCOUNTER — Ambulatory Visit (INDEPENDENT_AMBULATORY_CARE_PROVIDER_SITE_OTHER): Payer: Medicare Other

## 2023-02-25 ENCOUNTER — Ambulatory Visit: Payer: Medicare Other | Admitting: Podiatry

## 2023-02-25 DIAGNOSIS — M21612 Bunion of left foot: Secondary | ICD-10-CM | POA: Diagnosis not present

## 2023-02-25 DIAGNOSIS — M79675 Pain in left toe(s): Secondary | ICD-10-CM | POA: Diagnosis not present

## 2023-02-25 DIAGNOSIS — M21611 Bunion of right foot: Secondary | ICD-10-CM

## 2023-02-25 DIAGNOSIS — M79674 Pain in right toe(s): Secondary | ICD-10-CM

## 2023-02-25 DIAGNOSIS — B351 Tinea unguium: Secondary | ICD-10-CM

## 2023-02-25 NOTE — Progress Notes (Unsigned)
Subjective:   Patient ID: Rebecca Trevino, female   DOB: 67 y.o.   MRN: [id number]   HPI Chief Complaint  Patient presents with   Nail Problem    NP// BL BUNION - NAIL TRIM   67 year old female presents the office for above concerns.  She states that at times she feels that the toe will "lock up".  He has tenderness on the area of the bunion.  This been ongoing for quite some time she has a family history of these.  Progressively gotten worse and she is interested in surgical intervention.  She tried shoe modifications, without significant improvement.  She is also concerned that her nails are very thick and discolored and she cannot trim them herself.  No swelling redness or drainage to the toenail sites.   Review of Systems  All other systems reviewed and are negative.  Past Medical History:  Diagnosis Date   High blood pressure     Past Surgical History:  Procedure Laterality Date   TRANSPHENOIDAL APPROACH EXPOSURE N/A 10/10/2020   Procedure: ENDONASAL ENDOSCOPIC TRANSPHENOIDAL TUMOR RESECTION;  Surgeon: Jadene Pierini, MD;  Location: [facility];  Service: Neurosurgery;  Laterality: N/A;     Current Outpatient Medications:    albuterol (VENTOLIN HFA) 108 (90 Base) MCG/ACT inhaler, Inhale 1-2 puffs into the lungs as needed for wheezing or shortness of breath., Disp: 1 each, Rfl: 0   Calcium-Magnesium-Vitamin D (CALCIUM 1200+D3 PO), , Disp: , Rfl:    famotidine (PEPCID) 20 MG tablet, Take 1 tablet (20 mg total) by mouth daily., Disp: 90 tablet, Rfl: 3   hydrOXYzine (ATARAX) 25 MG tablet, TAKE 1 TABLET BY MOUTH AT  BEDTIME AS NEEDED FOR SLEEP, Disp: 100 tablet, Rfl: 3   levothyroxine (SYNTHROID) 50 MCG tablet, Take 1 tablet (50 mcg total) by mouth daily., Disp: 90 tablet, Rfl: 3   Magnesium 500 MG TABS, Take 500 mg by mouth daily., Disp: , Rfl:    Multiple Vitamins-Minerals (MULTIVITAMIN WITH MINERALS) tablet, Take 1 tablet by mouth daily., Disp: , Rfl:    ondansetron (ZOFRAN) 4 MG  tablet, Take 1 tablet (4 mg total) by mouth as needed for nausea or vomiting., Disp: 20 tablet, Rfl: 1  Allergies  Allergen Reactions   Codeine Nausea And Vomiting   Erythromycin Nausea And Vomiting           Objective:  Physical Exam  General: AAO x3, NAD  Dermatological: Nails are hypertrophic, dystrophic with yellow discoloration.  No edema no erythema.  Incurvation of the nails without any signs of infection.  Vascular: Dorsalis Pedis artery and Posterior Tibial artery pedal pulses are 2/4 bilateral with immedate capillary fill time. There is no pain with calf compression, swelling, warmth, erythema.   Neruologic: Grossly intact via light touch bilateral.   Musculoskeletal: Significant bunions present bilaterally.  There is no pain or crepitation with MPJ range of motion.  There is hypermobility present on the first ray.  Tenderness palpation directly on the bunion.  There is no other areas of tenderness.  There is no pain along the other areas of the Lisfranc joint where she has arthritis.  Gait: Unassisted, Nonantalgic.       Assessment:   67 year old female with symptomatic bunion deformity; onychodystrophy     Plan:  -Treatment options discussed including all alternatives, risks, and complications -Etiology of symptoms were discussed -X-rays were obtained reviewed.  Significant bunion deformity is present.  There is arthritic changes present the Lisfranc joint. -We discussed the  conservative as well as surgical treatment options.  She has longstanding history of bunion deformity is progressively getting worse and given the severe nature she was to proceed with surgical intervention.  Discussed different options and decided proceed with left Minta Balsam bunionectomy should she want to proceed with this.  We discussed the procedure as well as postoperative course.  Will get her insurance "for her.  Should she want to proceed with this we will schedule for formal  consent. -Sharply debrided the toenails x 10 without any complications or bleeding.  Discussed urea gel.  Vivi Barrack DPM

## 2023-02-25 NOTE — Patient Instructions (Signed)
You can use 'UREA NAIL GEL' ON THE TOENAIL ---   Bunion A bunion (hallux valgus) is a bump that forms slowly on the inner side of the big toe joint. It occurs when the big toe turns toward the second toe. Bunions may be small at first, but they often get larger over time. They can make walking painful. What are the causes? This condition may be caused by: Wearing narrow or pointed shoes that force the big toe to press against the other toes. Abnormal foot development that causes the foot to roll inward. Changes in the foot that are caused by certain diseases, such as rheumatoid arthritis or polio. A foot injury. What increases the risk? The following factors may make you more likely to develop this condition: Wearing shoes that squeeze the toes together. Having certain diseases, such as: Rheumatoid arthritis. Polio. Cerebral palsy. Having family members who have bunions. Being born with abnormally shaped feet (a foot deformity), such as flat feet or low arches. Doing activities that put a lot of pressure on the feet, such as ballet dancing. What are the signs or symptoms?  The main symptom of this condition is a bump on your big toe that you can notice. Other symptoms may include: Pain. Redness and inflammation around your big toe. Thick or hardened skin on your big toe or between your toes. Stiffness or loss of motion in your big toe. Trouble with walking. How is this diagnosed? This condition may be diagnosed based on your symptoms, medical history, and activities. You may also have tests and imaging, such as: X-rays. These allow your health care provider to check the position of the bones in your foot and look for damage to your joint. They also help your health care provider determine the severity of your bunion and the best way to treat it. Joint aspiration. In this test, a sample of fluid is removed from the toe joint. This test may be done if you are in a lot of pain. It  helps rule out diseases that cause painful swelling of the joints, such as arthritis or gout. How is this treated? Treatment depends on the severity of your symptoms. The goal of treatment is to relieve symptoms and prevent your bunion from getting worse. Your health care provider may recommend: Wearing shoes that have a wide toe box, or using bunion pads to cushion the affected area. Taping your toes together to keep them in a normal position. Placing a device inside your shoe (orthotic device) to help reduce pressure on your toe joint. Taking medicine to ease pain and inflammation. Putting ice or heat on the affected area. Doing stretching exercises. Surgery, for severe cases. Follow these instructions at home: Managing pain, stiffness, and swelling     If directed, put ice on the painful area. To do this: Put ice in a plastic bag. Place a towel between your skin and the bag. Leave the ice on for 20 minutes, 2-3 times a day. Remove the ice if your skin turns bright red. This is very important. If you cannot feel pain, heat, or cold, you have a greater risk of damage to the area. If directed, apply heat to the affected area before you exercise. Use the heat source that your health care provider recommends, such as a moist heat pack or a heating pad. Place a towel between your skin and the heat source. Leave the heat on for 20-30 minutes. Remove the heat if your skin turns bright red.  This is especially important if you are unable to feel pain, heat, or cold. You have a greater risk of getting burned. General instructions Do exercises as told by your health care provider. Support your toe joint with proper footwear, shoe padding, or taping as told by your health care provider. Take over-the-counter and prescription medicines only as told by your health care provider. Do not use any products that contain nicotine or tobacco, such as cigarettes, e-cigarettes, and chewing tobacco. If you  need help quitting, ask your health care provider. Keep all follow-up visits. This is important. Contact a health care provider if: Your symptoms get worse. Your symptoms do not improve in 2 weeks. Get help right away if: You have severe pain and trouble with walking. Summary A bunion is a bump on the inner side of the big toe joint that forms when the big toe turns toward the second toe. Bunions can make walking painful. Treatment depends on the severity of your symptoms. Support your toe joint with proper footwear, shoe padding, or taping as told by your health care provider. This information is not intended to replace advice given to you by your health care provider. Make sure you discuss any questions you have with your health care provider. Document Revised: 02/19/2020 Document Reviewed: 02/19/2020 Elsevier Patient Education  2023 [employer].

## 2023-02-26 ENCOUNTER — Encounter: Payer: Self-pay | Admitting: Podiatry

## 2023-02-27 ENCOUNTER — Other Ambulatory Visit (INDEPENDENT_AMBULATORY_CARE_PROVIDER_SITE_OTHER): Payer: Medicare Other

## 2023-02-27 DIAGNOSIS — E2749 Other adrenocortical insufficiency: Secondary | ICD-10-CM

## 2023-02-27 LAB — COMPREHENSIVE METABOLIC PANEL
ALT: 21 U/L (ref 0–35)
AST: 23 U/L (ref 0–37)
Albumin: 4 g/dL (ref 3.5–5.2)
Alkaline Phosphatase: 80 U/L (ref 39–117)
BUN: 19 mg/dL (ref 6–23)
CO2: 27 mEq/L (ref 19–32)
Calcium: 10 mg/dL (ref 8.4–10.5)
Chloride: 103 mEq/L (ref 96–112)
Creatinine, Ser: 0.95 mg/dL (ref 0.40–1.20)
GFR: 61.34 mL/min (ref >60.00)
Glucose, Bld: 95 mg/dL (ref 70–99)
Potassium: 4.3 mEq/L (ref 3.5–5.1)
Sodium: 137 mEq/L (ref 135–145)
Total Bilirubin: 0.3 mg/dL (ref 0.2–1.2)
Total Protein: 7.1 g/dL (ref 6.0–8.3)

## 2023-02-27 LAB — CBC
HCT: 39.6 % (ref 36.0–46.0)
Hemoglobin: 13.4 g/dL (ref 12.0–15.0)
MCHC: 33.8 g/dL (ref 30.0–36.0)
MCV: 89.2 fl (ref 78.0–100.0)
Platelets: 261 10*3/uL (ref 150.0–400.0)
RBC: 4.43 Mil/uL (ref 3.87–5.11)
RDW: 13.6 % (ref 11.5–15.5)
WBC: 5.6 10*3/uL (ref 4.0–10.5)

## 2023-02-27 LAB — LIPID PANEL
Cholesterol: 203 mg/dL — ABNORMAL HIGH (ref 0–200)
HDL: 52 mg/dL (ref >39.00)
LDL Cholesterol: 126 mg/dL — ABNORMAL HIGH (ref 0–99)
NonHDL: 151.05
Total CHOL/HDL Ratio: 4
Triglycerides: 123 mg/dL (ref 0.0–149.0)
VLDL: 24.6 mg/dL (ref 0.0–40.0)

## 2023-02-27 LAB — HEMOGLOBIN A1C: Hgb A1c MFr Bld: 5.4 % (ref 4.6–6.5)

## 2023-02-28 ENCOUNTER — Ambulatory Visit (INDEPENDENT_AMBULATORY_CARE_PROVIDER_SITE_OTHER): Payer: Medicare Other

## 2023-02-28 VITALS — Ht 66.5 in | Wt 151.0 lb

## 2023-02-28 DIAGNOSIS — Z0000 Encounter for general adult medical examination without abnormal findings: Secondary | ICD-10-CM | POA: Diagnosis not present

## 2023-02-28 NOTE — Progress Notes (Addendum)
I connected with  Rebecca Trevino on 02/28/23 by a audio enabled telemedicine application and verified that I am speaking with the correct person using two identifiers.  Patient Location: Home  Provider Location: Office/Clinic  I discussed the limitations of evaluation and management by telemedicine. The patient expressed understanding and agreed to proceed.  Patient Medicare AWV questionnaire was completed by the patient on 02/24/2023; I have confirmed that all information answered by patient is correct and no changes since this date.    Subjective:   Rebecca Trevino is a 68 y.o. female who presents for Medicare Annual (Subsequent) preventive examination.  Review of Systems     Cardiac Risk Factors include: advanced age (>25men, >64 women)     Objective:    Today's Vitals   02/28/23 1211 02/28/23 1212  Weight: 151 lb (68.5 kg)   Height: 5' 6.5" (1.689 m)   PainSc: 0-No pain 0-No pain   Body mass index is 24.01 kg/m.     02/28/2023   12:04 PM 02/07/2021    2:55 PM 10/10/2020    3:40 PM  Advanced Directives  Does Patient Have a Medical Advance Directive? No No No  Would patient like information on creating a medical advance directive? No - Patient declined No - Patient declined No - Patient declined    Current Medications (verified) Outpatient Encounter Medications as of 02/28/2023  Medication Sig   albuterol (VENTOLIN HFA) 108 (90 Base) MCG/ACT inhaler Inhale 1-2 puffs into the lungs as needed for wheezing or shortness of breath.   Calcium-Magnesium-Vitamin D (CALCIUM 1200+D3 PO)    famotidine (PEPCID) 20 MG tablet Take 1 tablet (20 mg total) by mouth daily.   hydrOXYzine (ATARAX) 25 MG tablet TAKE 1 TABLET BY MOUTH AT  BEDTIME AS NEEDED FOR SLEEP   levothyroxine (SYNTHROID) 50 MCG tablet Take 1 tablet (50 mcg total) by mouth daily.   Magnesium 500 MG TABS Take 500 mg by mouth daily.   Multiple Vitamins-Minerals (MULTIVITAMIN WITH MINERALS) tablet Take 1 tablet by mouth daily.    ondansetron (ZOFRAN) 4 MG tablet Take 1 tablet (4 mg total) by mouth as needed for nausea or vomiting.   No facility-administered encounter medications on file as of 02/28/2023.    Allergies (verified) Codeine and Erythromycin   History: Past Medical History:  Diagnosis Date   High blood pressure    Past Surgical History:  Procedure Laterality Date   TRANSPHENOIDAL APPROACH EXPOSURE N/A 10/10/2020   Procedure: ENDONASAL ENDOSCOPIC TRANSPHENOIDAL TUMOR RESECTION;  Surgeon: Jadene Pierini, MD;  Location: [facility] OR;  Service: Neurosurgery;  Laterality: N/A;   Family History  Problem Relation Age of Onset   Breast cancer Neg Hx    Social History   Socioeconomic History   Marital status: Married    Spouse name: Not on file   Number of children: Not on file   Years of education: Not on file   Highest education level: Not on file  Occupational History   Not on file  Tobacco Use   Smoking status: Never   Smokeless tobacco: Never  Substance and Sexual Activity   Alcohol use: Yes   Drug use: Never   Sexual activity: Not on file  Other Topics Concern   Not on file  Social History Narrative   Not on file   Social Determinants of Health   Financial Resource Strain: Low Risk  (02/28/2023)   Overall Financial Resource Strain (CARDIA)    Difficulty of Paying Living Expenses: Not hard  at all  Food Insecurity: No Food Insecurity (02/28/2023)   Hunger Vital Sign    Worried About Running Out of Food in the Last Year: Never true    Ran Out of Food in the Last Year: Never true  Transportation Needs: No Transportation Needs (02/28/2023)   PRAPARE - Administrator, Civil Service (Medical): No    Lack of Transportation (Non-Medical): No  Physical Activity: Sufficiently Active (02/28/2023)   Exercise Vital Sign    Days of Exercise per Week: 7 days    Minutes of Exercise per Session: 60 min  Stress: No Stress Concern Present (02/28/2023)   [employer] of Occupational  Health - Occupational Stress Questionnaire    Feeling of Stress : Not at all  Social Connections: Unknown (02/28/2023)   Social Connection and Isolation Panel [NHANES]    Frequency of Communication with Friends and Family: Twice a week    Frequency of Social Gatherings with Friends and Family: Once a week    Attends Religious Services: Not on Marketing executive or Organizations: No    Attends Banker Meetings: Never    Marital Status: Married    Tobacco Counseling Counseling given: Not Answered   Clinical Intake:  Pre-visit preparation completed: Yes  Pain : No/denies pain Pain Score: 0-No pain     BMI - recorded: 24.01 Nutritional Status: BMI of 19-24  Normal Nutritional Risks: None Diabetes: No  How often do you need to have someone help you when you read instructions, pamphlets, or other written materials from your doctor or pharmacy?: 1 - Never What is the last grade level you completed in school?: HSG  Diabetic? No  Interpreter Needed?: No  Information entered by :: Susie Cassette, LPN.   Activities of Daily Living    02/28/2023   12:18 PM 02/24/2023    9:10 AM  In your present state of health, do you have any difficulty performing the following activities:  Hearing? 0 0  Vision? 0 0  Difficulty concentrating or making decisions? 0 0  Walking or climbing stairs? 0 0  Dressing or bathing? 0 0  Doing errands, shopping? 0 0  Preparing Food and eating ? N N  Using the Toilet? N N  In the past six months, have you accidently leaked urine? N N  Do you have problems with loss of bowel control? N N  Managing your Medications? N N  Managing your Finances? N N  Housekeeping or managing your Housekeeping? N N    Patient Care Team: Myrlene Broker, MD as PCP - General (Internal Medicine) Janalyn Harder, MD (Inactive) as Consulting Physician (Dermatology) Ablott, Claris Gower as Consulting Physician (Optometry)  Indicate any recent  Medical Services you may have received from other than [facility] providers in the past year (date may be approximate).     Assessment:   This is a routine wellness examination for Rebecca Trevino.  Hearing/Vision screen Hearing Screening - Comments:: Denies hearing difficulties   Vision Screening - Comments:: Wears rx glasses - up to date with routine eye exams with Dr. Erby Pian ([facility])   Dietary issues and exercise activities discussed: Current Exercise Habits: Home exercise routine, Type of exercise: walking;treadmill;stretching;strength training/weights;exercise ball;calisthenics;yoga, Time (Minutes): 60, Frequency (Times/Week): 7, Weekly Exercise (Minutes/Week): 420, Intensity: Moderate, Exercise limited by: None identified   Goals Addressed             This Visit's Progress    Manage My Cholesterol  Timeframe:  Long-Range Goal Priority:  High Start Date:  02/28/2023                           Expected End Date: 6-8 months                      Follow Up Date 02/28/2024    - change to whole grain breads, cereal, pasta - eat smaller or less servings of red meat - fill half the plate with nonstarchy vegetables - get blood test (fasting) done 1 week before next visit - increase the amount of fiber in food - read food labels for fat and fiber - switch to low-fat or skim milk    Why is this important?   Changing cholesterol starts with eating heart-healthy foods.  Other steps may be to increase your activity and to quit if you smoke.    Notes:  1. Eat heart-healthy foods A few changes in your diet can reduce cholesterol and improve your heart health: Reduce saturated fats. Saturated fats, found primarily in red meat and full-fat dairy products, raise your total cholesterol. Decreasing your consumption of saturated fats can reduce your low-density lipoprotein (LDL) cholesterol -- the "bad" cholesterol.  Eliminate trans fats. Trans fats, sometimes listed on food labels  as "partially hydrogenated vegetable oil," are often used in margarines and store-bought cookies, crackers and cakes. Trans fats raise overall cholesterol levels. The Food and Drug Administration has banned the use of partially hydrogenated vegetable oils by Jan. 1, 2021.  Eat foods rich in omega-3 fatty acids. Omega-3 fatty acids don't affect LDL cholesterol. But they have other heart-healthy benefits, including reducing blood pressure. Foods with omega-3 fatty acids include salmon, mackerel, herring, walnuts and flaxseeds. Increase soluble fiber. Soluble fiber can reduce the absorption of cholesterol into your bloodstream. Soluble fiber is found in such foods as oatmeal, kidney beans, Brussels sprouts, apples and pears. Add whey protein. Whey protein, which is found in dairy products, may account for many of the health benefits attributed to dairy. Studies have shown that whey protein given as a supplement lowers both LDL cholesterol and total cholesterol as well as blood pressure.  2. Exercise on most days of the week and increase your physical activity Exercise can improve cholesterol. Moderate physical activity can help raise high-density lipoprotein (HDL) cholesterol, the "good" cholesterol. With your doctor's OK, work up to at least 30 minutes of exercise five times a week or vigorous aerobic activity for 20 minutes three times a week.  Adding physical activity, even in short intervals several times a day, can help you begin to lose weight. Consider:  Taking a brisk daily walk during your lunch hour Riding your bike to work Playing a favorite sport To stay motivated, consider finding an exercise buddy or joining an exercise group.  3. Quit smoking Quitting smoking improves your HDL cholesterol level. The benefits occur quickly:  Within 20 minutes of quitting, your blood pressure and heart rate recover from the cigarette-induced spike Within three months of quitting, your blood circulation  and lung function begin to improve Within a year of quitting, your risk of heart disease is half that of a smoker  4. Lose weight Carrying even a few extra pounds contributes to high cholesterol. Small changes add up. If you drink sugary beverages, switch to tap water. Snack on air-popped popcorn or pretzels -- but keep track of the calories. If you crave something sweet,  try sherbet or candies with little or no fat, such as jelly beans.  Look for ways to incorporate more activity into your daily routine, such as using the stairs instead of taking the elevator or parking farther from your office. Take walks during breaks at work. Try to increase standing activities, such as cooking or doing yardwork.  5. Drink alcohol only in moderation Moderate use of alcohol has been linked with higher levels of HDL cholesterol -- but the benefits aren't strong enough to recommend alcohol for anyone who doesn't already drink.  If you drink alcohol, do so in moderation. For healthy adults, that means up to one drink a day for women of all ages and men older than age 76, and up to two drinks a day for men age 87 and younger.  Too much alcohol can lead to serious health problems, including high blood pressure, heart failure and strokes.  If lifestyle changes aren't enough . Sometimes healthy lifestyle changes aren't enough to lower cholesterol levels. If your doctor recommends medication to help lower your cholesterol, take it as prescribed while continuing your lifestyle changes. Lifestyle changes can help you k      Depression Screen    02/28/2023   12:07 PM 02/16/2022   11:04 AM 01/17/2021    2:17 PM  PHQ 2/9 Scores  PHQ - 2 Score 0 0 0  PHQ- 9 Score 0      Fall Risk    02/28/2023   12:18 PM 02/24/2023    9:10 AM  Fall Risk   Falls in the past year? 0 0  Number falls in past yr: 0 0  Injury with Fall? 0 0  Risk for fall due to : No Fall Risks   Follow up Falls prevention discussed     FALL RISK  PREVENTION PERTAINING TO THE HOME:  Any stairs in or around the home? Yes  If so, are there any without handrails? No  Home free of loose throw rugs in walkways, pet beds, electrical cords, etc? Yes  Adequate lighting in your home to reduce risk of falls? Yes   ASSISTIVE DEVICES UTILIZED TO PREVENT FALLS:  Life alert? No  Use of a cane, walker or w/c? No  Grab bars in the bathroom? No  Shower chair or bench in shower? No  Elevated toilet seat or a handicapped toilet? Yes   TIMED UP AND GO:  Was the test performed? No . Telephonic Visit  Cognitive Function:        02/28/2023   12:07 PM  6CIT Screen  What Year? 0 points  What month? 0 points  What time? 0 points  Count back from 20 0 points  Months in reverse 0 points  Repeat phrase 0 points  Total Score 0 points    Immunizations Immunization History  Administered Date(s) Administered   COVID-19, mRNA, vaccine(Comirnaty)12 years and older 08/01/2022   Fluad Quad(high Dose 65+) 07/14/2021   Influenza, High Dose Seasonal PF 08/19/2014   Influenza, Quadrivalent, Recombinant, Inj, Pf 07/28/2018, 06/26/2019, 07/22/2020   Influenza-Unspecified 07/25/2012, 07/17/2013, 08/08/2022   PFIZER Comirnaty(Gray Top)Covid-19 Tri-Sucrose Vaccine 03/20/2021   PFIZER(Purple Top)SARS-COV-2 Vaccination 12/05/2019, 12/26/2019, 07/09/2020, 03/20/2021, 08/25/2021   PNEUMOCOCCAL CONJUGATE-20 02/16/2022   RSV,unspecified 07/20/2022   Zoster Recombinat (Shingrix) 01/17/2021, 04/05/2021   Zoster, Live 09/06/2012    TDAP status: Due, Education has been provided regarding the importance of this vaccine. Advised may receive this vaccine at local pharmacy or Health Dept. Aware to provide a copy of  the vaccination record if obtained from local pharmacy or Health Dept. Verbalized acceptance and understanding.  Flu Vaccine status: Up to date  Pneumococcal vaccine status: Up to date  Covid-19 vaccine status: Completed vaccines  Qualifies for  Shingles Vaccine? Yes   Zostavax completed Yes   Shingrix Completed?: Yes  Screening Tests Health Maintenance  Topic Date Due   Hepatitis C Screening  Never done   DTaP/Tdap/Td (1 - Tdap) Never done   INFLUENZA VACCINE  05/30/2023   MAMMOGRAM  06/29/2023   Medicare Annual Wellness (AWV)  02/28/2024   Fecal DNA (Cologuard)  07/19/2024   Pneumonia Vaccine 47+ Years old  Completed   DEXA SCAN  Completed   COVID-19 Vaccine  Completed   Zoster Vaccines- Shingrix  Completed   HPV VACCINES  Aged Out    Health Maintenance  Health Maintenance Due  Topic Date Due   Hepatitis C Screening  Never done   DTaP/Tdap/Td (1 - Tdap) Never done    Colorectal cancer screening: Type of screening: Cologuard. Completed 07/19/2021. Repeat every 3 years  Mammogram status: Completed 06/28/2021. Repeat every year Completed every 2 years  Bone Density status: Completed 08/07/2021. Results reflect: Bone density results: OSTEOPENIA. Repeat every 3-5 years.  Lung Cancer Screening: (Low Dose CT Chest recommended if Age 57-80 years, 30 pack-year currently smoking OR have quit w/in 15years.) does not qualify.   Lung Cancer Screening Referral: No  Additional Screening:  Hepatitis C Screening: does not qualify; Completed: No  Vision Screening: Recommended annual ophthalmology exams for early detection of glaucoma and other disorders of the eye. Is the patient up to date with their annual eye exam?  Yes  Who is the provider or what is the name of the office in which the patient attends annual eye exams? Charlotte Ablott, OD. If pt is not established with a provider, would they like to be referred to a provider to establish care? No .   Dental Screening: Recommended annual dental exams for proper oral hygiene  Community Resource Referral / Chronic Care Management: CRR required this visit?  No   CCM required this visit?  No      Plan:     I have personally reviewed and noted the following in the  patient's chart:   Medical and social history Use of alcohol, tobacco or illicit drugs  Current medications and supplements including opioid prescriptions. Patient is not currently taking opioid prescriptions. Functional ability and status Nutritional status Physical activity Advanced directives List of other physicians Hospitalizations, surgeries, and ER visits in previous 12 months Vitals Screenings to include cognitive, depression, and falls Referrals and appointments  In addition, I have reviewed and discussed with patient certain preventive protocols, quality metrics, and best practice recommendations. A written personalized care plan for preventive services as well as general preventive health recommendations were provided to patient.     Mickeal Needy, LPN   4/0/9811   Nurse Notes:  Normal cognitive status assessed by direct observation via telephone conversation by this Nurse Health Advisor. No abnormalities found.

## 2023-02-28 NOTE — Patient Instructions (Addendum)
Rebecca Trevino , Thank you for taking time to come for your Medicare Wellness Visit. I appreciate your ongoing commitment to your health goals. Please review the following plan we discussed and let me know if I can assist you in the future.   These are the goals we discussed:  Goals      Manage My Cholesterol     Timeframe:  Long-Range Goal Priority:  High Start Date:  02/28/2023                           Expected End Date: 6-8 months                      Follow Up Date 02/28/2024    - change to whole grain breads, cereal, pasta - eat smaller or less servings of red meat - fill half the plate with nonstarchy vegetables - get blood test (fasting) done 1 week before next visit - increase the amount of fiber in food - read food labels for fat and fiber - switch to low-fat or skim milk    Why is this important?   Changing cholesterol starts with eating heart-healthy foods.  Other steps may be to increase your activity and to quit if you smoke.    Notes:  1. Eat heart-healthy foods A few changes in your diet can reduce cholesterol and improve your heart health: Reduce saturated fats. Saturated fats, found primarily in red meat and full-fat dairy products, raise your total cholesterol. Decreasing your consumption of saturated fats can reduce your low-density lipoprotein (LDL) cholesterol -- the "bad" cholesterol.  Eliminate trans fats. Trans fats, sometimes listed on food labels as "partially hydrogenated vegetable oil," are often used in margarines and store-bought cookies, crackers and cakes. Trans fats raise overall cholesterol levels. The Food and Drug Administration has banned the use of partially hydrogenated vegetable oils by Jan. 1, 2021.  Eat foods rich in omega-3 fatty acids. Omega-3 fatty acids don't affect LDL cholesterol. But they have other heart-healthy benefits, including reducing blood pressure. Foods with omega-3 fatty acids include salmon, mackerel, herring, walnuts and  flaxseeds. Increase soluble fiber. Soluble fiber can reduce the absorption of cholesterol into your bloodstream. Soluble fiber is found in such foods as oatmeal, kidney beans, Brussels sprouts, apples and pears.  Add whey protein. Whey protein, which is found in dairy products, may account for many of the health benefits attributed to dairy. Studies have shown that whey protein given as a supplement lowers both LDL cholesterol and total cholesterol as well as blood pressure.  2. Exercise on most days of the week and increase your physical activity Exercise can improve cholesterol. Moderate physical activity can help raise high-density lipoprotein (HDL) cholesterol, the "good" cholesterol. With your doctor's OK, work up to at least 30 minutes of exercise five times a week or vigorous aerobic activity for 20 minutes three times a week.  Adding physical activity, even in short intervals several times a day, can help you begin to lose weight. Consider:  Taking a brisk daily walk during your lunch hour Riding your bike to work Playing a favorite sport To stay motivated, consider finding an exercise buddy or joining an exercise group.  3. Quit smoking Quitting smoking improves your HDL cholesterol level. The benefits occur quickly:  Within 20 minutes of quitting, your blood pressure and heart rate recover from the cigarette-induced spike Within three months of quitting, your blood circulation and lung  function begin to improve Within a year of quitting, your risk of heart disease is half that of a smoker  4. Lose weight Carrying even a few extra pounds contributes to high cholesterol. Small changes add up. If you drink sugary beverages, switch to tap water. Snack on air-popped popcorn or pretzels -- but keep track of the calories. If you crave something sweet, try sherbet or candies with little or no fat, such as jelly beans.  Look for ways to incorporate more activity into your daily routine,  such as using the stairs instead of taking the elevator or parking farther from your office. Take walks during breaks at work. Try to increase standing activities, such as cooking or doing yardwork.  5. Drink alcohol only in moderation Moderate use of alcohol has been linked with higher levels of HDL cholesterol -- but the benefits aren't strong enough to recommend alcohol for anyone who doesn't already drink.  If you drink alcohol, do so in moderation. For healthy adults, that means up to one drink a day for women of all ages and men older than age 75, and up to two drinks a day for men age 64 and younger.  Too much alcohol can lead to serious health problems, including high blood pressure, heart failure and strokes.  If lifestyle changes aren't enough . Sometimes healthy lifestyle changes aren't enough to lower cholesterol levels. If your doctor recommends medication to help lower your cholesterol, take it as prescribed while continuing your lifestyle changes. Lifestyle changes can help you k        This is a list of the screening recommended for you and due dates:  Health Maintenance  Topic Date Due   Hepatitis C Screening: USPSTF Recommendation to screen - Ages 24-79 yo.  Never done   DTaP/Tdap/Td vaccine (1 - Tdap) Never done   Flu Shot  05/30/2023   Mammogram  06/29/2023   Medicare Annual Wellness Visit  02/28/2024   Cologuard (Stool DNA test)  07/19/2024   Pneumonia Vaccine  Completed   DEXA scan (bone density measurement)  Completed   COVID-19 Vaccine  Completed   Zoster (Shingles) Vaccine  Completed   HPV Vaccine  Aged Out    Advanced directives: No  Conditions/risks identified: Yes; Elevated Cholesterol  Next appointment: Follow up in one year for your annual wellness visit.   Preventive Care 2 Years and Older, Female Preventive care refers to lifestyle choices and visits with your health care provider that can promote health and wellness. What does preventive care  include? A yearly physical exam. This is also called an annual well check. Dental exams once or twice a year. Routine eye exams. Ask your health care provider how often you should have your eyes checked. Personal lifestyle choices, including: Daily care of your teeth and gums. Regular physical activity. Eating a healthy diet. Avoiding tobacco and drug use. Limiting alcohol use. Practicing safe sex. Taking low-dose aspirin every day. Taking vitamin and mineral supplements as recommended by your health care provider. What happens during an annual well check? The services and screenings done by your health care provider during your annual well check will depend on your age, overall health, lifestyle risk factors, and family history of disease. Counseling  Your health care provider may ask you questions about your: Alcohol use. Tobacco use. Drug use. Emotional well-being. Home and relationship well-being. Sexual activity. Eating habits. History of falls. Memory and ability to understand (cognition). Work and work Astronomer. Reproductive health. Screening  You may have the following tests or measurements: Height, weight, and BMI. Blood pressure. Lipid and cholesterol levels. These may be checked every 5 years, or more frequently if you are over 14 years old. Skin check. Lung cancer screening. You may have this screening every year starting at age 44 if you have a 30-pack-year history of smoking and currently smoke or have quit within the past 15 years. Fecal occult blood test (FOBT) of the stool. You may have this test every year starting at age 19. Flexible sigmoidoscopy or colonoscopy. You may have a sigmoidoscopy every 5 years or a colonoscopy every 10 years starting at age 75. Hepatitis C blood test. Hepatitis B blood test. Sexually transmitted disease (STD) testing. Diabetes screening. This is done by checking your blood sugar (glucose) after you have not eaten for a while  (fasting). You may have this done every 1-3 years. Bone density scan. This is done to screen for osteoporosis. You may have this done starting at age 69. Mammogram. This may be done every 1-2 years. Talk to your health care provider about how often you should have regular mammograms. Talk with your health care provider about your test results, treatment options, and if necessary, the need for more tests. Vaccines  Your health care provider may recommend certain vaccines, such as: Influenza vaccine. This is recommended every year. Tetanus, diphtheria, and acellular pertussis (Tdap, Td) vaccine. You may need a Td booster every 10 years. Zoster vaccine. You may need this after age 37. Pneumococcal 13-valent conjugate (PCV13) vaccine. One dose is recommended after age 20. Pneumococcal polysaccharide (PPSV23) vaccine. One dose is recommended after age 78. Talk to your health care provider about which screenings and vaccines you need and how often you need them. This information is not intended to replace advice given to you by your health care provider. Make sure you discuss any questions you have with your health care provider. Document Released: 11/11/2015 Document Revised: 07/04/2016 Document Reviewed: 08/16/2015 Elsevier Interactive Patient Education  2017 [employer].  Fall Prevention in the Home Falls can cause injuries. They can happen to people of all ages. There are many things you can do to make your home safe and to help prevent falls. What can I do on the outside of my home? Regularly fix the edges of walkways and driveways and fix any cracks. Remove anything that might make you trip as you walk through a door, such as a raised step or threshold. Trim any bushes or trees on the path to your home. Use bright outdoor lighting. Clear any walking paths of anything that might make someone trip, such as rocks or tools. Regularly check to see if handrails are loose or broken. Make sure that  both sides of any steps have handrails. Any raised decks and porches should have guardrails on the edges. Have any leaves, snow, or ice cleared regularly. Use sand or salt on walking paths during winter. Clean up any spills in your garage right away. This includes oil or grease spills. What can I do in the bathroom? Use night lights. Install grab bars by the toilet and in the tub and shower. Do not use towel bars as grab bars. Use non-skid mats or decals in the tub or shower. If you need to sit down in the shower, use a plastic, non-slip stool. Keep the floor dry. Clean up any water that spills on the floor as soon as it happens. Remove soap buildup in the tub or shower regularly.  Attach bath mats securely with double-sided non-slip rug tape. Do not have throw rugs and other things on the floor that can make you trip. What can I do in the bedroom? Use night lights. Make sure that you have a light by your bed that is easy to reach. Do not use any sheets or blankets that are too big for your bed. They should not hang down onto the floor. Have a firm chair that has side arms. You can use this for support while you get dressed. Do not have throw rugs and other things on the floor that can make you trip. What can I do in the kitchen? Clean up any spills right away. Avoid walking on wet floors. Keep items that you use a lot in easy-to-reach places. If you need to reach something above you, use a strong step stool that has a grab bar. Keep electrical cords out of the way. Do not use floor polish or wax that makes floors slippery. If you must use wax, use non-skid floor wax. Do not have throw rugs and other things on the floor that can make you trip. What can I do with my stairs? Do not leave any items on the stairs. Make sure that there are handrails on both sides of the stairs and use them. Fix handrails that are broken or loose. Make sure that handrails are as long as the stairways. Check  any carpeting to make sure that it is firmly attached to the stairs. Fix any carpet that is loose or worn. Avoid having throw rugs at the top or bottom of the stairs. If you do have throw rugs, attach them to the floor with carpet tape. Make sure that you have a light switch at the top of the stairs and the bottom of the stairs. If you do not have them, ask someone to add them for you. What else can I do to help prevent falls? Wear shoes that: Do not have high heels. Have rubber bottoms. Are comfortable and fit you well. Are closed at the toe. Do not wear sandals. If you use a stepladder: Make sure that it is fully opened. Do not climb a closed stepladder. Make sure that both sides of the stepladder are locked into place. Ask someone to hold it for you, if possible. Clearly mark and make sure that you can see: Any grab bars or handrails. First and last steps. Where the edge of each step is. Use tools that help you move around (mobility aids) if they are needed. These include: Canes. Walkers. Scooters. Crutches. Turn on the lights when you go into a dark area. Replace any light bulbs as soon as they burn out. Set up your furniture so you have a clear path. Avoid moving your furniture around. If any of your floors are uneven, fix them. If there are any pets around you, be aware of where they are. Review your medicines with your doctor. Some medicines can make you feel dizzy. This can increase your chance of falling. Ask your doctor what other things that you can do to help prevent falls. This information is not intended to replace advice given to you by your health care provider. Make sure you discuss any questions you have with your health care provider. Document Released: 08/11/2009 Document Revised: 03/22/2016 Document Reviewed: 11/19/2014 Elsevier Interactive Patient Education  2017 [employer].

## 2023-03-01 ENCOUNTER — Ambulatory Visit (INDEPENDENT_AMBULATORY_CARE_PROVIDER_SITE_OTHER): Payer: Medicare Other | Admitting: Internal Medicine

## 2023-03-01 ENCOUNTER — Encounter: Payer: Self-pay | Admitting: Internal Medicine

## 2023-03-01 VITALS — BP 120/96 | HR 82 | Temp 97.6°F | Ht 66.5 in | Wt 150.0 lb

## 2023-03-01 DIAGNOSIS — Z0000 Encounter for general adult medical examination without abnormal findings: Secondary | ICD-10-CM

## 2023-03-01 DIAGNOSIS — E230 Hypopituitarism: Secondary | ICD-10-CM | POA: Diagnosis not present

## 2023-03-01 NOTE — Assessment & Plan Note (Signed)
Flu shot yearly. Pneumonia complete. Shingrix complete. Tetanus due at pharmacy. Colonoscopy up to date. Mammogram up to date, pap smear aged out and dexa up to date. Counseled about sun safety and mole surveillance. Counseled about the dangers of distracted driving. Given 10 year screening recommendations.

## 2023-03-01 NOTE — Assessment & Plan Note (Signed)
She is seeing endo and off steroids for now with repeat testing showing improvement.

## 2023-03-01 NOTE — Progress Notes (Signed)
   Subjective:   Patient ID: Rebecca Trevino, female    DOB: 09/11/1956, 67 y.o.   MRN: [id number]  HPI The patient is here for physical.  PMH, [facility], social history reviewed and updated  Review of Systems  Constitutional: Negative.   HENT: Negative.    Eyes: Negative.   Respiratory:  Negative for cough, chest tightness and shortness of breath.   Cardiovascular:  Negative for chest pain, palpitations and leg swelling.  Gastrointestinal:  Negative for abdominal distention, abdominal pain, constipation, diarrhea, nausea and vomiting.  Musculoskeletal: Negative.   Skin: Negative.   Neurological: Negative.   Psychiatric/Behavioral: Negative.      Objective:  Physical Exam Constitutional:      Appearance: She is well-developed.  HENT:     Head: Normocephalic and atraumatic.  Cardiovascular:     Rate and Rhythm: Normal rate and regular rhythm.  Pulmonary:     Effort: Pulmonary effort is normal. No respiratory distress.     Breath sounds: Normal breath sounds. No wheezing or rales.  Abdominal:     General: Bowel sounds are normal. There is no distension.     Palpations: Abdomen is soft.     Tenderness: There is no abdominal tenderness. There is no rebound.  Musculoskeletal:     Cervical back: Normal range of motion.  Skin:    General: Skin is warm and dry.  Neurological:     Mental Status: She is alert and oriented to person, place, and time.     Coordination: Coordination normal.    Vitals:   03/01/23 1105 03/01/23 1109  BP: (!) 120/96 (!) 120/96  Pulse: 82   Temp: 97.6 F (36.4 C)   TempSrc: Oral   SpO2: 99%   Weight: 150 lb (68 kg)   Height: 5' 6.5" (1.689 m)     Assessment & Plan:

## 2023-03-08 NOTE — Telephone Encounter (Signed)
FYI  (I will respond about the quote)

## 2023-03-14 ENCOUNTER — Other Ambulatory Visit: Payer: Self-pay | Admitting: Internal Medicine

## 2023-03-14 DIAGNOSIS — Z1231 Encounter for screening mammogram for malignant neoplasm of breast: Secondary | ICD-10-CM

## 2023-04-22 ENCOUNTER — Ambulatory Visit: Payer: Medicare Other | Admitting: Dermatology

## 2023-05-09 ENCOUNTER — Encounter: Payer: Self-pay | Admitting: Internal Medicine

## 2023-05-10 NOTE — Telephone Encounter (Signed)
MD is out of the office until 7/15. Sending msg to DOD Pls advise on cough syrup request.../lmb

## 2023-05-16 MED ORDER — ALBUTEROL SULFATE HFA 108 (90 BASE) MCG/ACT IN AERS: 1.0000 | INHALATION_SPRAY | RESPIRATORY_TRACT | 0 refills | Status: DC | PRN

## 2023-05-16 NOTE — Addendum Note (Signed)
Addended by: Deatra James on: 05/16/2023 03:36 PM   Modules accepted: Orders

## 2023-05-27 ENCOUNTER — Encounter: Payer: Self-pay | Admitting: Internal Medicine

## 2023-05-29 ENCOUNTER — Encounter: Payer: Self-pay | Admitting: Internal Medicine

## 2023-05-30 ENCOUNTER — Encounter: Payer: Self-pay | Admitting: Internal Medicine

## 2023-05-31 ENCOUNTER — Encounter: Payer: Self-pay | Admitting: Internal Medicine

## 2023-06-04 MED ORDER — ALBUTEROL SULFATE HFA 108 (90 BASE) MCG/ACT IN AERS: 2.0000 | INHALATION_SPRAY | Freq: Four times a day (QID) | RESPIRATORY_TRACT | 2 refills | Status: DC | PRN

## 2023-06-04 NOTE — Addendum Note (Signed)
Addended by: Hillard Danker A on: 06/04/2023 11:22 AM   Modules accepted: Orders

## 2023-06-12 ENCOUNTER — Ambulatory Visit
Admission: RE | Admit: 2023-06-12 | Discharge: 2023-06-12 | Disposition: A | Discharge status: Home / Self Care | Payer: Medicare Other | Source: Ambulatory Visit

## 2023-06-12 DIAGNOSIS — Z1231 Encounter for screening mammogram for malignant neoplasm of breast: Secondary | ICD-10-CM | POA: Diagnosis not present

## 2023-06-13 LAB — HM MAMMOGRAPHY

## 2023-06-13 LAB — COLOGUARD: Cologuard: NEGATIVE

## 2023-06-14 ENCOUNTER — Encounter: Payer: Self-pay | Admitting: Internal Medicine

## 2023-06-25 ENCOUNTER — Other Ambulatory Visit: Payer: Self-pay | Admitting: Neurological Surgery

## 2023-06-25 DIAGNOSIS — D352 Benign neoplasm of pituitary gland: Secondary | ICD-10-CM

## 2023-07-08 ENCOUNTER — Telehealth: Payer: Self-pay

## 2023-07-08 NOTE — Telephone Encounter (Signed)
Sarah with [employer] called to get approval for manufacture change on the Levothyroxine.

## 2023-07-09 MED ORDER — LEVOTHYROXINE SODIUM 50 MCG PO TABS: 50.0000 ug | ORAL_TABLET | Freq: Every day | ORAL | 3 refills | Status: DC

## 2023-07-15 ENCOUNTER — Encounter: Payer: Self-pay | Admitting: Internal Medicine

## 2023-07-16 ENCOUNTER — Other Ambulatory Visit: Payer: Self-pay

## 2023-07-16 MED ORDER — LEVOTHYROXINE SODIUM 50 MCG PO TABS: 50.0000 ug | ORAL_TABLET | Freq: Every day | ORAL | 3 refills | Status: DC

## 2023-08-09 ENCOUNTER — Encounter: Payer: Self-pay | Admitting: Internal Medicine

## 2023-08-09 ENCOUNTER — Ambulatory Visit
Admission: RE | Admit: 2023-08-09 | Discharge: 2023-08-09 | Disposition: A | Discharge status: Home / Self Care | Payer: Medicare Other | Source: Ambulatory Visit | Attending: Neurological Surgery | Admitting: Neurological Surgery

## 2023-08-09 DIAGNOSIS — I6782 Cerebral ischemia: Secondary | ICD-10-CM | POA: Diagnosis not present

## 2023-08-09 DIAGNOSIS — D352 Benign neoplasm of pituitary gland: Secondary | ICD-10-CM

## 2023-08-09 MED ORDER — GADOPICLENOL 0.5 MMOL/ML IV SOLN
7.5000 mL | Freq: Once | INTRAVENOUS | Status: AC | PRN
  Administered 2023-08-09: 7.5 mL via INTRAVENOUS

## 2023-08-12 MED ORDER — FLUTICASONE PROPIONATE 50 MCG/ACT NA SUSP: 2.0000 | Freq: Every day | NASAL | 3 refills | Status: DC

## 2023-08-12 NOTE — Telephone Encounter (Signed)
Please send in prescription for Flonase as I don't see this on the medication list

## 2023-08-21 ENCOUNTER — Ambulatory Visit: Payer: Medicare Other | Admitting: Internal Medicine

## 2023-08-21 ENCOUNTER — Encounter: Payer: Self-pay | Admitting: Internal Medicine

## 2023-08-21 VITALS — BP 122/84 | HR 85 | Ht 66.5 in | Wt 150.2 lb

## 2023-08-21 DIAGNOSIS — E038 Other specified hypothyroidism: Secondary | ICD-10-CM

## 2023-08-21 DIAGNOSIS — E893 Postprocedural hypopituitarism: Secondary | ICD-10-CM

## 2023-08-21 MED ORDER — LEVOTHYROXINE SODIUM 50 MCG PO TABS: 50.0000 ug | ORAL_TABLET | Freq: Every day | ORAL | 3 refills | Status: DC

## 2023-08-21 NOTE — Progress Notes (Signed)
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 She has followed with [facility] since 11/24/2020 for consultative assistance with management of her hypopituitarism.   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 Post-op MRI showed enlarged sella with right residual pituitary noted on MRI in 11/2020 and 02/2022   Preoperatively the patient had a low ACTH at 4.6 PG/mL (7.2-63.3) with a cortisol level of 1.4 UG/DL, LH and FSH inappropriately normal at 0.9 and 7.7 m IU/mL respectively.  Prolactin normal at 20 ng/mL , no IGF-1 but was noted with low somatomedin C.  She was also noted to have a normal TSH at 0.488 u IU/mL with low free T4 at 0.55 ng/dL   She also had hyponatremia 115 mEq but this responded well with fluid restriction    By 07/2022 her ACTH has normalized, we switched from prednisone to hydrocortisone  By 10/2022 ACTH continue to improve at  17 PG/mL, fasting cortisol with holding hydrocortisone prior night was 8.8 UG/DL.  Patient advised to wean off hydrocortisone which was discontinued around February 2024   Cosyntropin stimulation test was normal 01/2023 with 30-minute cortisol 20.3 UG/dL    SUBJECTIVE:    Today (08/21/2023):  Rebecca Trevino is here for a follow up on hypopituitarism.   She has been off Hydrocortisone since 11/2022 Weight stable  She has dizziness  over the past week, due to allergies  Denies nausea, or vomiting  denies constipation or diarrhea  Denies palpitations  Nocturia ~ 1-2 x  Denies vision changes and no headaches except sinus related   Did not hold  Biotin   Levothyroxine 50 mcg daily        HISTORY:  Past Medical History:  Past Medical History:  Diagnosis Date   High blood pressure    Past Surgical History:  Past Surgical History:  Procedure Laterality Date   TRANSPHENOIDAL APPROACH EXPOSURE N/A 10/10/2020   Procedure: ENDONASAL ENDOSCOPIC TRANSPHENOIDAL TUMOR RESECTION;  Surgeon: Jadene Pierini, MD;  Location: [facility] OR;  Service: Neurosurgery;  Laterality: N/A;   Social History:  reports that she has never smoked. She has never used smokeless tobacco. She reports current alcohol use. She reports that she does not use drugs. Family History:  Family History  Problem Relation Age of Onset   Breast cancer Neg Hx      HOME MEDICATIONS: Allergies as of 08/21/2023       Reactions   Codeine Nausea And Vomiting   Erythromycin Nausea And Vomiting        Medication List        Accurate as of August 21, 2023 10:25 AM. If you have any questions, ask your nurse or doctor.          albuterol 108 (90 Base) MCG/ACT inhaler Commonly known as: VENTOLIN HFA Inhale 2 puffs into the lungs every 6 (six) hours as needed for wheezing or shortness of breath.   CALCIUM 1200+D3 PO   famotidine 20 MG tablet Commonly known as: Pepcid Take 1 tablet (20 mg total) by mouth daily.   fluticasone 50 MCG/ACT nasal  spray Commonly known as: FLONASE Place 2 sprays into both nostrils daily.   hydrOXYzine 25 MG tablet Commonly known as: ATARAX TAKE 1 TABLET BY MOUTH AT  BEDTIME AS NEEDED FOR SLEEP   levothyroxine 50 MCG tablet Commonly known as: SYNTHROID Take 1 tablet (50 mcg total) by mouth daily.   Magnesium 500 MG Tabs Take 500 mg by mouth daily.   multivitamin with minerals tablet Take 1 tablet by mouth daily.   ondansetron 4 MG tablet Commonly known as: ZOFRAN Take 1 tablet (4 mg total) by mouth as needed for nausea or vomiting.          OBJECTIVE:   PHYSICAL EXAM: VS: BP 122/84 (BP Location: Left Arm,  Patient Position: Sitting, Cuff Size: Small)   Pulse 85   Ht 5' 6.5" (1.689 m)   Wt 150 lb 3.2 oz (68.1 kg)   SpO2 99%   BMI 23.88 kg/m    Body surface area is 1.79 meters squared.   EXAM: General: Pt appears well and is in NAD  Neck: General: Supple without adenopathy. Thyroid: Thyroid size normal.  No goiter or nodules appreciated.  Lungs: Clear with good BS bilat   Heart: Auscultation: RRR.  Abdomen: soft, nontender  Extremities:  BL LE: No pretibial edema  Mental Status: Judgment, insight: Intact Orientation: Oriented to time, place, and person Mood and affect: No depression, anxiety, or agitation     DATA REVIEWED:   Latest Reference Range & Units 02/20/23 10:55  TSH 0.35 - 5.50 uIU/mL 1.45  T4,Free(Direct) 0.60 - 1.60 ng/dL 4.09    Latest Reference Range & Units 02/20/23 10:55 02/20/23 11:35 02/20/23 11:56  Cortisol, Plasma ug/dL 81.1 91.4 78.2   MRI 9/56/2130  FINDINGS: Brain: Prior trans-sphenoidal resection of pituitary tumor. The sella remains enlarged and unchanged. No recurrent tumor. Infundibulum mildly deviated to the right unchanged. Small amount of enhancing tissue in the right sella may represent residual pituitary which is unchanged. Optic chiasm normal. Cavernous sinus normal bilaterally.   Ventricle size normal. No acute infarct or hemorrhage. Mild chronic microvascular ischemic changes in the white matter are stable.   Vascular: Normal arterial flow voids at the skull base   Skull and upper cervical spine: No focal abnormality.   Sinuses/Orbits: Mucosal edema in the sphenoid sinus due to prior trans-sphenoidal surgery.   Other: None   IMPRESSION: Stable MRI of the brain and pituitary. Postop changes of pituitary resection. No recurrent tumor.   Mild chronic microvascular ischemic change in the white matter stable.    ASSESSMENT / PLAN / RECOMMENDATIONS:   S/P Transphenoidal  hypophysectomy:   -Her surgery was December 2021 due  to pituitary apoplexy -She had required levothyroxine and hydrocortisone prior to the surgery -She has been off hydrocortisone since February 2024 with normal cosyntropin stimulation test. -MRI May 2023 showed stable postop pituitary changes -No local symptoms    2. Secondary Hypothyroidism:  -Patient is clinically euthyroid -Unable to proceed with TFT checkup today, as the patient did not hold biotin -She will return for repeat TFTs  Medication Continue levothyroxine 50 mcg daily  Follow-up in 1 year  Signed electronically by: Lyndle Herrlich, MD  [facility]  [facility] [address] [place] [place], [place] [address] Phone: [phone] FAX: [phone]      CC: Myrlene Broker, MD [address] [place] [address] Phone: [phone]  Fax: [phone]   Return to Endocrinology clinic as below: Future Appointments  Date Time Provider  Department Center  09/30/2023 10:30 AM Terri Piedra, DO [facility] None  03/03/2024 11:30 AM [facility]-ANNUAL WELLNESS VISIT [facility] None  03/04/2024 10:20 AM Myrlene Broker, MD [facility] None

## 2023-08-21 NOTE — Patient Instructions (Addendum)
Hold Biotin 2-3 days prior to any future thyroid  lab work    You are on levothyroxine - which is your thyroid hormone supplement. You MUST take this consistently.  You should take this first thing in the morning on an empty stomach with water. You should not take it with other medications. Wait to 1hr prior to eating. If you are taking any vitamins - please take these in the evening.   If you miss a dose, please take your missed dose the following day (double the dose for that day). You should have a pill box for ONLY levothyroxine on your bedside table to help you remember to take your medications.

## 2023-08-26 ENCOUNTER — Other Ambulatory Visit (INDEPENDENT_AMBULATORY_CARE_PROVIDER_SITE_OTHER): Payer: Medicare Other

## 2023-08-26 DIAGNOSIS — E038 Other specified hypothyroidism: Secondary | ICD-10-CM

## 2023-08-26 LAB — TSH: TSH: 1.82 u[IU]/mL (ref 0.35–5.50)

## 2023-08-26 LAB — T4, FREE: Free T4: 0.97 ng/dL (ref 0.60–1.60)

## 2023-08-27 ENCOUNTER — Other Ambulatory Visit: Payer: Self-pay | Admitting: Internal Medicine

## 2023-09-12 ENCOUNTER — Encounter: Payer: Self-pay | Admitting: Internal Medicine

## 2023-09-12 DIAGNOSIS — D352 Benign neoplasm of pituitary gland: Secondary | ICD-10-CM | POA: Diagnosis not present

## 2023-09-25 ENCOUNTER — Encounter: Payer: Self-pay | Admitting: Internal Medicine

## 2023-09-25 NOTE — Telephone Encounter (Signed)
Please advise as MD is out of office

## 2023-09-30 ENCOUNTER — Encounter: Payer: Self-pay | Admitting: Dermatology

## 2023-09-30 ENCOUNTER — Ambulatory Visit: Payer: Medicare Other | Admitting: Dermatology

## 2023-09-30 VITALS — BP 119/77

## 2023-09-30 DIAGNOSIS — L658 Other specified nonscarring hair loss: Secondary | ICD-10-CM

## 2023-09-30 DIAGNOSIS — L659 Nonscarring hair loss, unspecified: Secondary | ICD-10-CM

## 2023-09-30 MED ORDER — SAFETY SEAL MISCELLANEOUS MISC
1.0000 | Freq: Every morning | 0 refills | Status: DC
Start: 2023-09-30 — End: 2024-01-29

## 2023-09-30 NOTE — Patient Instructions (Addendum)
Hello [place],  Thank you for visiting my office today. Your dedication to addressing your health concerns, especially in managing your hair loss post-treatment for a pituitary tumor, is truly commendable. Here is a summary of the key instructions and recommendations from our consultation:  - Collagen with Biotin: Continue using the collagen with Biotin supplement as you have been. Ensure the product you choose is suitable for your specific needs.  - Supplements: Maintain your current regimen of multivitamins and calcium supplements to support overall health.  - Hair Monitoring: Keep an eye on areas of hair thinning and any new changes in your hair growth patterns. This will help [place] track your progress and adjust treatment as necessary.  - Medications: No new medications or topical treatments were prescribed today. We will continue to monitor your condition and can consider additional treatments if needed.  Please monitor the condition of your hair and inform us of any significant changes or if the situation worsens. We are here to reassess and explore other treatment options if necessary.  Thank you once again for choosing our clinic for your dermatological needs. We are here to support you on your journey to better health and look forward to seeing your progress.  Warm regards,  Dr. [facility]                   Important Information  Due to recent changes in healthcare laws, you may see results of your pathology and/or laboratory studies on MyChart before the doctors have had a chance to review them. We understand that in some cases there may be results that are confusing or concerning to you. Please understand that not all results are received at the same time and often the doctors may need to interpret multiple results in order to provide you with the best plan of care or course of treatment. Therefore, we ask that you please give [place] 2 business days to thoroughly review  all your results before contacting the office for clarification. Should we see a critical lab result, you will be contacted sooner.   If You Need Anything After Your Visit  If you have any questions or concerns for your doctor, please call our main line at [phone] If no one answers, please leave a voicemail as directed and we will return your call as soon as possible. Messages left after 4 pm will be answered the following business day.   You may also send [place] a message via MyChart. We typically respond to MyChart messages within 1-2 business days.  For prescription refills, please ask your pharmacy to contact our office. Our fax number is [phone].  If you have an urgent issue when the clinic is closed that cannot wait until the next business day, you can page your doctor at the number below.    Please note that while we do our best to be available for urgent issues outside of office hours, we are not available 24/7.   If you have an urgent issue and are unable to reach [place], you may choose to seek medical care at your doctor's office, retail clinic, urgent care center, or emergency room.  If you have a medical emergency, please immediately call 911 or go to the emergency department. In the event of inclement weather, please call our main line at [phone] for an update on the status of any delays or closures.  Dermatology Medication Tips: Please keep the boxes that topical medications come in in order  to help keep track of the instructions about where and how to use these. Pharmacies typically print the medication instructions only on the boxes and not directly on the medication tubes.   If your medication is too expensive, please contact our office at [phone] or send [place] a message through MyChart.   We are unable to tell what your co-pay for medications will be in advance as this is different depending on your insurance coverage. However, we may be able to find a substitute  medication at lower cost or fill out paperwork to get insurance to cover a needed medication.   If a prior authorization is required to get your medication covered by your insurance company, please allow [place] 1-2 business days to complete this process.  Drug prices often vary depending on where the prescription is filled and some pharmacies may offer cheaper prices.  The website www.goodrx.com contains coupons for medications through different pharmacies. The prices here do not account for what the cost may be with help from insurance (it may be cheaper with your insurance), but the website can give you the price if you did not use any insurance.  - You can print the associated coupon and take it with your prescription to the pharmacy.  - You may also stop by our office during regular business hours and pick up a GoodRx coupon card.  - If you need your prescription sent electronically to a different pharmacy, notify our office through [facility] or by phone at [phone]

## 2023-09-30 NOTE — Progress Notes (Signed)
   New Patient Visit   Subjective  Rebecca Trevino is a 67 y.o. female who presents for the following: New Pt - Hair Loss  Patient states she has hair loss located at the scalp that she would like to have examined. Patient reports the areas have been there for 3 years post removal off brain mass on her pituitary gland. She describes the hair loss at "patchy". She reports the areas are not bothersome.Patient rates irritation 0 out of 10. She states that the areas have not spread. Patient reports she has not previously been treated for these areas. Patient denies Hx of bx. Patient reports family history of skin cancer(s) (father - melanoma).  The following portions of the chart were reviewed this encounter and updated as appropriate: medications, allergies, medical history  Review of Systems:  No other skin or systemic complaints except as noted in HPI or Assessment and Plan.  Objective  Well appearing patient in no apparent distress; mood and affect are within normal limits.    A focused examination was performed of the following areas:   Relevant exam findings are noted in the Assessment and Plan.          Assessment & Plan   Rebecca Trevino presents with patchy hair loss that began after treatment for a non-cancerous pituitary adenoma, which has since been resolved. She has been off prednisone for six months and reports thinning hair, particularly on the sides, with some regrowth on the top. She has been taking collagen with biotin for over a year but has not tried any topical treatments. There are no associated symptoms such as itching, and her pituitary function is now normal.   Patchy Alopecia - Assessment: Patient presents with patchy hair loss, which began following treatment for a non-cancerous pituitary adenoma. The patient notes thinning primarily on the sides of the scalp, with some regrowth on the top. No excessive shedding or scalp symptoms have been reported. The patient has  discontinued prednisone six months ago, and pituitary function is considered normal. There is no significant family history of hair thinning. - Plan: - Recommended OTC shampoo and supplements of Vital Protein, Nutrafol,  SEEN shampoo. - [facility] - Minoxidil - used daily in the morning.     No follow-ups on file.    Documentation: I have reviewed the above documentation for accuracy and completeness, and I agree with the above.   I, Rebecca Trevino, CMA, am acting as scribe for [employer], DO.   Langston Reusing, DO

## 2023-10-03 ENCOUNTER — Encounter: Payer: Self-pay | Admitting: Dermatology

## 2023-11-10 ENCOUNTER — Encounter: Payer: Self-pay | Admitting: Internal Medicine

## 2023-11-15 ENCOUNTER — Encounter: Payer: Self-pay | Admitting: Internal Medicine

## 2023-11-20 ENCOUNTER — Other Ambulatory Visit: Payer: Self-pay | Admitting: Medical Genetics

## 2023-12-18 ENCOUNTER — Other Ambulatory Visit (HOSPITAL_COMMUNITY): Payer: Self-pay

## 2024-01-01 ENCOUNTER — Telehealth: Payer: Self-pay | Admitting: Internal Medicine

## 2024-01-01 DIAGNOSIS — E2749 Other adrenocortical insufficiency: Secondary | ICD-10-CM

## 2024-01-01 DIAGNOSIS — E230 Hypopituitarism: Secondary | ICD-10-CM

## 2024-01-01 NOTE — Telephone Encounter (Signed)
 Pt has cpe scheduled for 5.7.25 and is requesting to have labs ordered so she can get them 5.2.25 before her apoointment.

## 2024-01-10 NOTE — Telephone Encounter (Signed)
 Labs placed.

## 2024-01-13 ENCOUNTER — Encounter: Payer: Self-pay | Admitting: Internal Medicine

## 2024-01-13 ENCOUNTER — Ambulatory Visit: Payer: Medicare Other | Admitting: Internal Medicine

## 2024-01-13 ENCOUNTER — Ambulatory Visit (INDEPENDENT_AMBULATORY_CARE_PROVIDER_SITE_OTHER)

## 2024-01-13 VITALS — BP 116/84 | HR 89 | Temp 97.8°F | Ht 66.5 in | Wt 151.0 lb

## 2024-01-13 DIAGNOSIS — M25551 Pain in right hip: Secondary | ICD-10-CM

## 2024-01-13 DIAGNOSIS — M47816 Spondylosis without myelopathy or radiculopathy, lumbar region: Secondary | ICD-10-CM | POA: Diagnosis not present

## 2024-01-13 DIAGNOSIS — M8580 Other specified disorders of bone density and structure, unspecified site: Secondary | ICD-10-CM | POA: Diagnosis not present

## 2024-01-13 NOTE — Progress Notes (Unsigned)
   Subjective:   Patient ID: Rebecca Trevino, female    DOB: 03-22-1956, 68 y.o.   MRN: [id number]  HPI The patient is a 68 YO female coming in for hip pain especially when sitting for awhile.   Review of Systems  Objective:  Physical Exam  Vitals:   01/13/24 0959  BP: 116/84  Pulse: 89  Temp: 97.8 F (36.6 C)  TempSrc: Oral  SpO2: 93%  Weight: 151 lb (68.5 kg)  Height: 5' 6.5" (1.689 m)    Assessment & Plan:

## 2024-01-17 DIAGNOSIS — M25551 Pain in right hip: Secondary | ICD-10-CM | POA: Insufficient documentation

## 2024-01-17 NOTE — Assessment & Plan Note (Signed)
 Given exercises to help. Checking x-ray right hip and pelvis to assess for arthritis burden.

## 2024-01-28 ENCOUNTER — Encounter: Payer: Self-pay | Admitting: Internal Medicine

## 2024-01-29 ENCOUNTER — Encounter: Payer: Self-pay | Admitting: Dermatology

## 2024-01-29 ENCOUNTER — Ambulatory Visit: Payer: Medicare Other | Admitting: Dermatology

## 2024-01-29 DIAGNOSIS — L659 Nonscarring hair loss, unspecified: Secondary | ICD-10-CM | POA: Diagnosis not present

## 2024-01-29 MED ORDER — SAFETY SEAL MISCELLANEOUS MISC: 1.0000 | Freq: Every morning | 2 refills | Status: AC

## 2024-01-29 NOTE — Patient Instructions (Addendum)
 Hello [place],  Thank you for visiting today. Here is a summary of the key instructions:  - Medications:   - Resume using 8% minoxidil every morning   - Continue taking 2 Viviscal daily   - Continue taking Vital Proteins daily  - Instructions:   - Apply minoxidil in the morning to avoid getting it on your face   - Continue using minoxidil consistently for optimal results   - If you need to simplify your routine, prioritize minoxidil over supplements  - Follow-up:   - Next appointment in 6 months   - Message [place] on MyChart if any changes occur before then   - Expect a call from the pharmacy about your minoxidil refill  Please reach out if you have any questions or concerns.  Warm regards,  Dr. Langston Reusing, Dermatology Important Information  Due to recent changes in healthcare laws, you may see results of your pathology and/or laboratory studies on MyChart before the doctors have had a chance to review them. We understand that in some cases there may be results that are confusing or concerning to you. Please understand that not all results are received at the same time and often the doctors may need to interpret multiple results in order to provide you with the best plan of care or course of treatment. Therefore, we ask that you please give [place] 2 business days to thoroughly review all your results before contacting the office for clarification. Should we see a critical lab result, you will be contacted sooner.   If You Need Anything After Your Visit  If you have any questions or concerns for your doctor, please call our main line at [phone] If no one answers, please leave a voicemail as directed and we will return your call as soon as possible. Messages left after 4 pm will be answered the following business day.   You may also send [place] a message via MyChart. We typically respond to MyChart messages within 1-2 business days.  For prescription refills, please ask your pharmacy to  contact our office. Our fax number is [phone].  If you have an urgent issue when the clinic is closed that cannot wait until the next business day, you can page your doctor at the number below.    Please note that while we do our best to be available for urgent issues outside of office hours, we are not available 24/7.   If you have an urgent issue and are unable to reach [place], you may choose to seek medical care at your doctor's office, retail clinic, urgent care center, or emergency room.  If you have a medical emergency, please immediately call 911 or go to the emergency department. In the event of inclement weather, please call our main line at [phone] for an update on the status of any delays or closures.  Dermatology Medication Tips: Please keep the boxes that topical medications come in in order to help keep track of the instructions about where and how to use these. Pharmacies typically print the medication instructions only on the boxes and not directly on the medication tubes.   If your medication is too expensive, please contact our office at [phone] or send [place] a message through MyChart.   We are unable to tell what your co-pay for medications will be in advance as this is different depending on your insurance coverage. However, we may be able to find a substitute medication at lower cost or fill out paperwork to  get insurance to cover a needed medication.   If a prior authorization is required to get your medication covered by your insurance company, please allow [place] 1-2 business days to complete this process.  Drug prices often vary depending on where the prescription is filled and some pharmacies may offer cheaper prices.  The website www.goodrx.com contains coupons for medications through different pharmacies. The prices here do not account for what the cost may be with help from insurance (it may be cheaper with your insurance), but the website can give you the price  if you did not use any insurance.  - You can print the associated coupon and take it with your prescription to the pharmacy.  - You may also stop by our office during regular business hours and pick up a GoodRx coupon card.  - If you need your prescription sent electronically to a different pharmacy, notify our office through [facility] or by phone at [phone]

## 2024-01-29 NOTE — Progress Notes (Signed)
   Follow-Up Visit   Subjective  Rebecca Trevino is a 68 y.o. female who presents for the following: alopecia  Patient present today for follow up visit. Patient was last evaluated on 09/30/23. At this visit patient was prescribed [facility] Minoxidil - use QAM. Recommended OTC Seen shampoo, Vital Protein & Viviscal. Patient reports sxs are better. Patient denies medication changes.  The following portions of the chart were reviewed this encounter and updated as appropriate: medications, allergies, medical history  Review of Systems:  No other skin or systemic complaints except as noted in HPI or Assessment and Plan.  Objective  Well appearing patient in no apparent distress; mood and affect are within normal limits.   A focused examination was performed of the following areas: scalp   Relevant exam findings are noted in the Assessment and Plan.            Assessment & Plan   ALOPECIA  - Assessment: Patient was seen 4 months ago and started on topical minoxidil 8% every morning. She has been taking Viviscal (2 tablets daily) and Vital Proteins supplements consistently. Patient reports reduced thinning and thicker hair growth. Visual comparison to previous images shows improvement, particularly on the side, with less patchy appearance. No adverse effects from minoxidil reported. Patient ran out of minoxidil about a month ago.  - Plan:    Renew prescription for topical minoxidil 8% with 8 refills    Continue Viviscal (2 tablets daily) and Vital Proteins supplements    Apply minoxidil in the morning to avoid facial contact    Continue current hair care products (Humectris)    Patient educated on importance of consistent minoxidil use to maintain results    Advised that oral minoxidil is an option but may cause unwanted hair growth    Instructed to prioritize minoxidil over supplements if simplification is needed  ALOPECIA   Related Medications Safety Seal Miscellaneous MISC 1  Application by Does not apply route every morning. Medication name: Minoxidil solution  Return in about 6 months (around 07/30/2024) for alopecia.   Documentation: I have reviewed the above documentation for accuracy and completeness, and I agree with the above.  I, Shirron Marcha Solders, CMA, am acting as scribe for [employer], DO.   Langston Reusing, DO

## 2024-02-04 DIAGNOSIS — Z1211 Encounter for screening for malignant neoplasm of colon: Secondary | ICD-10-CM | POA: Diagnosis not present

## 2024-02-04 DIAGNOSIS — Z1212 Encounter for screening for malignant neoplasm of rectum: Secondary | ICD-10-CM | POA: Diagnosis not present

## 2024-02-04 LAB — COLOGUARD: Cologuard: NEGATIVE

## 2024-02-07 ENCOUNTER — Encounter: Payer: Self-pay | Admitting: Internal Medicine

## 2024-02-11 LAB — EXTERNAL GENERIC LAB PROCEDURE: COLOGUARD: NEGATIVE

## 2024-02-11 LAB — COLOGUARD: COLOGUARD: NEGATIVE

## 2024-02-28 ENCOUNTER — Other Ambulatory Visit (INDEPENDENT_AMBULATORY_CARE_PROVIDER_SITE_OTHER)

## 2024-02-28 DIAGNOSIS — E2749 Other adrenocortical insufficiency: Secondary | ICD-10-CM | POA: Diagnosis not present

## 2024-02-28 LAB — COMPREHENSIVE METABOLIC PANEL WITH GFR
ALT: 16 U/L (ref 0–35)
AST: 20 U/L (ref 0–37)
Albumin: 4.2 g/dL (ref 3.5–5.2)
Alkaline Phosphatase: 72 U/L (ref 39–117)
BUN: 16 mg/dL (ref 6–23)
CO2: 29 meq/L (ref 19–32)
Calcium: 9.4 mg/dL (ref 8.4–10.5)
Chloride: 103 meq/L (ref 96–112)
Creatinine, Ser: 0.82 mg/dL (ref 0.40–1.20)
GFR: 72.67 mL/min (ref >60.00)
Glucose, Bld: 94 mg/dL (ref 70–99)
Potassium: 4 meq/L (ref 3.5–5.1)
Sodium: 137 meq/L (ref 135–145)
Total Bilirubin: 0.6 mg/dL (ref 0.2–1.2)
Total Protein: 7.1 g/dL (ref 6.0–8.3)

## 2024-02-28 LAB — LIPID PANEL
Cholesterol: 216 mg/dL — ABNORMAL HIGH (ref 0–200)
HDL: 52.9 mg/dL (ref >39.00)
LDL Cholesterol: 134 mg/dL — ABNORMAL HIGH (ref 0–99)
NonHDL: 162.69
Total CHOL/HDL Ratio: 4
Triglycerides: 141 mg/dL (ref 0.0–149.0)
VLDL: 28.2 mg/dL (ref 0.0–40.0)

## 2024-02-28 LAB — HEMOGLOBIN A1C: Hgb A1c MFr Bld: 5.5 % (ref 4.6–6.5)

## 2024-03-01 LAB — CBC
HCT: 38.4 % (ref 36.0–46.0)
Hemoglobin: 12.9 g/dL (ref 12.0–15.0)
MCHC: 33.5 g/dL (ref 30.0–36.0)
MCV: 90.6 fl (ref 78.0–100.0)
Platelets: 250 10*3/uL (ref 150.0–400.0)
RBC: 4.24 Mil/uL (ref 3.87–5.11)
RDW: 13.6 % (ref 11.5–15.5)
WBC: 4.5 10*3/uL (ref 4.0–10.5)

## 2024-03-02 ENCOUNTER — Encounter: Payer: Self-pay | Admitting: Internal Medicine

## 2024-03-03 ENCOUNTER — Encounter: Payer: Self-pay | Admitting: Internal Medicine

## 2024-03-03 ENCOUNTER — Ambulatory Visit (INDEPENDENT_AMBULATORY_CARE_PROVIDER_SITE_OTHER): Payer: Medicare Other

## 2024-03-03 VITALS — Ht 66.5 in | Wt 147.0 lb

## 2024-03-03 DIAGNOSIS — Z1159 Encounter for screening for other viral diseases: Secondary | ICD-10-CM | POA: Diagnosis not present

## 2024-03-03 DIAGNOSIS — Z0000 Encounter for general adult medical examination without abnormal findings: Secondary | ICD-10-CM

## 2024-03-03 NOTE — Progress Notes (Signed)
 Subjective:   Rebecca Trevino is a 68 y.o. who presents for a Medicare Wellness preventive visit.  Visit Complete: Virtual I connected with  [name] on 03/03/24 by a audio enabled telemedicine application and verified that I am speaking with the correct person using two identifiers.  Patient Location: Home  Provider Location: Office/Clinic  I discussed the limitations of evaluation and management by telemedicine. The patient expressed understanding and agreed to proceed.  Vital Signs: Because this visit was a virtual/telehealth visit, some criteria may be missing or patient reported. Any vitals not documented were not able to be obtained and vitals that have been documented are patient reported.  VideoDeclined- This patient declined Librarian, academic. Therefore the visit was completed with audio only.  Persons Participating in Visit: Patient.  AWV Questionnaire: Yes: Patient Medicare AWV questionnaire was completed by the patient on 02/28/2024; I have confirmed that all information answered by patient is correct and no changes since this date.  Cardiac Risk Factors include: advanced age (>48men, >73 women)     Objective:    Today's Vitals   03/03/24 1237  Weight: 147 lb (66.7 kg)  Height: 5' 6.5" (1.689 m)   Body mass index is 23.37 kg/m.     03/03/2024   12:36 PM 02/28/2023   12:04 PM 02/07/2021    2:55 PM 10/10/2020    3:40 PM  Advanced Directives  Does Patient Have a Medical Advance Directive? No No No No  Would patient like information on creating a medical advance directive? No - Patient declined No - Patient declined No - Patient declined No - Patient declined    Current Medications (verified) Outpatient Encounter Medications as of 03/03/2024  Medication Sig   Calcium-Magnesium -Vitamin D (CALCIUM 1200+D3 PO)    famotidine  (PEPCID ) 20 MG tablet Take 1 tablet (20 mg total) by mouth daily.   fluticasone  (FLONASE ) 50 MCG/ACT nasal spray  Place 2 sprays into both nostrils daily.   hydrOXYzine  (ATARAX ) 25 MG tablet TAKE 1 TABLET BY MOUTH AT  BEDTIME AS NEEDED FOR SLEEP   levothyroxine  (SYNTHROID ) 50 MCG tablet Take 1 tablet (50 mcg total) by mouth daily.   Magnesium  500 MG TABS Take 500 mg by mouth daily.   Multiple Vitamins-Minerals (MULTIVITAMIN WITH MINERALS) tablet Take 1 tablet by mouth daily.   ondansetron  (ZOFRAN ) 4 MG tablet Take 1 tablet (4 mg total) by mouth as needed for nausea or vomiting.   Safety Seal Miscellaneous MISC 1 Application by Does not apply route every morning. Medication name: Minoxidil solution   VENTOLIN  HFA 108 (90 Base) MCG/ACT inhaler USE 2 INHALATIONS BY MOUTH EVERY 6 HOURS AS NEEDED FOR WHEEZING  OR SHORTNESS OF BREATH   No facility-administered encounter medications on file as of 03/03/2024.    Allergies (verified) Codeine and Erythromycin   History: Past Medical History:  Diagnosis Date   High blood pressure    Past Surgical History:  Procedure Laterality Date   TRANSPHENOIDAL APPROACH EXPOSURE N/A 10/10/2020   Procedure: ENDONASAL ENDOSCOPIC TRANSPHENOIDAL TUMOR RESECTION;  Surgeon: Cannon Champion, MD;  Location: [facility] OR;  Service: Neurosurgery;  Laterality: N/A;   Family History  Problem Relation Age of Onset   Breast cancer Neg Hx    Social History   Socioeconomic History   Marital status: Married    Spouse name: Not on file   Number of children: Not on file   Years of education: Not on file   Highest education level: Bachelor's degree (  e.g., BA, AB, BS)  Occupational History   Not on file  Tobacco Use   Smoking status: Never    Passive exposure: Never   Smokeless tobacco: Never  Substance and Sexual Activity   Alcohol use: Yes    Alcohol/week: 1.0 standard drink of alcohol    Types: 1 Glasses of wine per week   Drug use: Never   Sexual activity: Not on file  Other Topics Concern   Not on file  Social History Narrative   Married   Social Drivers of Health    Financial Resource Strain: Low Risk  (03/03/2024)   Overall Financial Resource Strain (CARDIA)    Difficulty of Paying Living Expenses: Not hard at all  Food Insecurity: No Food Insecurity (03/03/2024)   Hunger Vital Sign    Worried About Running Out of Food in the Last Year: Never true    Ran Out of Food in the Last Year: Never true  Transportation Needs: No Transportation Needs (03/03/2024)   PRAPARE - Administrator, Civil Service (Medical): No    Lack of Transportation (Non-Medical): No  Physical Activity: Sufficiently Active (03/03/2024)   Exercise Vital Sign    Days of Exercise per Week: 6 days    Minutes of Exercise per Session: 70 min  Stress: No Stress Concern Present (03/03/2024)   [employer] of Occupational Health - Occupational Stress Questionnaire    Feeling of Stress : Not at all  Social Connections: Unknown (03/03/2024)   Social Connection and Isolation Panel [NHANES]    Frequency of Communication with Friends and Family: More than three times a week    Frequency of Social Gatherings with Friends and Family: Once a week    Attends Religious Services: Patient declined    Database administrator or Organizations: No    Attends Engineer, structural: Never    Marital Status: Married    Tobacco Counseling Counseling given: No    Clinical Intake:  Pre-visit preparation completed: Yes  Pain : No/denies pain     BMI - recorded: 23.37 Nutritional Status: BMI of 19-24  Normal Nutritional Risks: None Diabetes: No  Lab Results  Component Value Date   HGBA1C 5.5 02/28/2024   HGBA1C 5.4 02/27/2023   HGBA1C 5.5 01/29/2022     How often do you need to have someone help you when you read instructions, pamphlets, or other written materials from your doctor or pharmacy?: 1 - Never  Interpreter Needed?: No  Information entered by :: Kandy Orris, CMA   Activities of Daily Living     03/03/2024   12:39 PM 02/28/2024   10:53 AM  In your present  state of health, do you have any difficulty performing the following activities:  Hearing? 0 0  Vision? 0 0  Difficulty concentrating or making decisions? 0 0  Walking or climbing stairs? 0 0  Dressing or bathing? 0 0  Doing errands, shopping? 0 0  Preparing Food and eating ? N N  Using the Toilet? N N  In the past six months, have you accidently leaked urine? N N  Do you have problems with loss of bowel control? N N  Managing your Medications? N N  Managing your Finances? N N  Housekeeping or managing your Housekeeping? N N    Patient Care Team: Adelia Homestead, MD as PCP - General (Internal Medicine) Devon Fogo, MD (Inactive) as Consulting Physician (Dermatology) Ali Antonio as Consulting Physician (Optometry)  Indicate any recent Medical  Services you may have received from other than [facility] providers in the past year (date may be approximate).     Assessment:   This is a routine wellness examination for Darnice.  Hearing/Vision screen Hearing Screening - Comments:: Denies hearing difficulties   Vision Screening - Comments:: Wears rx glasses - up to date with routine eye exams with Dr Shellye Dibble (appt on this week)   Goals Addressed               This Visit's Progress     Patient Stated (pt-stated)        Patient stated she plans to continue to exercise.       Depression Screen     03/03/2024   12:42 PM 02/28/2023   12:07 PM 02/16/2022   11:04 AM 01/17/2021    2:17 PM  PHQ 2/9 Scores  PHQ - 2 Score 0 0 0 0  PHQ- 9 Score 0 0      Fall Risk     03/03/2024   12:40 PM 02/28/2024   10:53 AM 01/13/2024   10:07 AM 02/28/2023   12:18 PM 02/24/2023    9:10 AM  Fall Risk   Falls in the past year? 0 0 0 0 0  Number falls in past yr: 0  0 0 0  Injury with Fall? 0 0 0 0 0  Risk for fall due to : No Fall Risks   No Fall Risks   Follow up Falls prevention discussed;Falls evaluation completed  Falls evaluation completed Falls prevention discussed     MEDICARE RISK  AT HOME:  Medicare Risk at Home Any stairs in or around the home?: Yes If so, are there any without handrails?: No Home free of loose throw rugs in walkways, pet beds, electrical cords, etc?: Yes Adequate lighting in your home to reduce risk of falls?: Yes Life alert?: No Use of a cane, walker or w/c?: No Grab bars in the bathroom?: No Shower chair or bench in shower?: No Elevated toilet seat or a handicapped toilet?: Yes  TIMED UP AND GO:  Was the test performed?  No  Cognitive Function: 6CIT completed        03/03/2024   12:43 PM 02/28/2023   12:07 PM  6CIT Screen  What Year? 0 points 0 points  What month? 0 points 0 points  What time? 0 points 0 points  Count back from 20 0 points 0 points  Months in reverse 0 points 0 points  Repeat phrase 0 points 0 points  Total Score 0 points 0 points    Immunizations Immunization History  Administered Date(s) Administered   Fluad Quad(high Dose 65+) 07/14/2021   Influenza, High Dose Seasonal PF 08/19/2014   Influenza, Quadrivalent, Recombinant, Inj, Pf 07/28/2018, 06/26/2019, 07/22/2020   Influenza-Unspecified 07/25/2012, 07/17/2013, 08/08/2022, 07/17/2023   PFIZER Comirnaty(Gray Top)Covid-19 Tri-Sucrose Vaccine 03/20/2021   PFIZER(Purple Top)SARS-COV-2 Vaccination 12/05/2019, 12/26/2019, 07/09/2020, 03/20/2021, 08/25/2021   PNEUMOCOCCAL CONJUGATE-20 02/16/2022   Pfizer(Comirnaty)Fall Seasonal Vaccine 12 years and older 08/01/2022   RSV,unspecified 07/20/2022   Zoster Recombinant(Shingrix) 01/17/2021, 04/05/2021   Zoster, Live 09/06/2012    Screening Tests Health Maintenance  Topic Date Due   Hepatitis C Screening  Never done   DTaP/Tdap/Td (1 - Tdap) Never done   COVID-19 Vaccine (8 - 2024-25 season) 06/30/2023   INFLUENZA VACCINE  05/29/2024   Medicare Annual Wellness (AWV)  03/03/2025   MAMMOGRAM  06/12/2025   Fecal DNA (Cologuard)  02/04/2027   Pneumonia Vaccine 43+ Years old  Completed   DEXA SCAN  Completed    Zoster Vaccines- Shingrix  Completed   HPV VACCINES  Aged Out   Meningococcal B Vaccine  Aged Out    Health Maintenance  Health Maintenance Due  Topic Date Due   Hepatitis C Screening  Never done   DTaP/Tdap/Td (1 - Tdap) Never done   COVID-19 Vaccine (8 - 2024-25 season) 06/30/2023   Health Maintenance Items Addressed: Hepatitis C Screening ordered today.   Asked pt to obtain her immunization report from [facility] pharmacy since stating had Tdap vaccine.   Additional Screening:  Vision Screening: Recommended annual ophthalmology exams for early detection of glaucoma and other disorders of the eye.  Dental Screening: Recommended annual dental exams for proper oral hygiene  Community Resource Referral / Chronic Care Management: CRR required this visit?  No   CCM required this visit?  No     Plan:     I have personally reviewed and noted the following in the patient's chart:   Medical and social history Use of alcohol, tobacco or illicit drugs  Current medications and supplements including opioid prescriptions. Patient is not currently taking opioid prescriptions. Functional ability and status Nutritional status Physical activity Advanced directives List of other physicians Hospitalizations, surgeries, and ER visits in previous 12 months Vitals Screenings to include cognitive, depression, and falls Referrals and appointments  In addition, I have reviewed and discussed with patient certain preventive protocols, quality metrics, and best practice recommendations. A written personalized care plan for preventive services as well as general preventive health recommendations were provided to patient.     Patria Bookbinder, CMA   03/03/2024   After Visit Summary: (MyChart) Due to this being a telephonic visit, the after visit summary with patients personalized plan was offered to patient via MyChart   Notes: Nothing significant to report at this time.

## 2024-03-03 NOTE — Patient Instructions (Signed)
 Rebecca Trevino , Thank you for taking time to come for your Medicare Wellness Visit. I appreciate your ongoing commitment to your health goals. Please review the following plan we discussed and let me know if I can assist you in the future.   Referrals/Orders/Follow-Ups/Clinician Recommendations: Aim for 30 minutes of exercise or brisk walking, 6-8 glasses of water, and 5 servings of fruits and vegetables each day. Patient will obtain a copy of immunization report to update immunization list at PCP's office.    This is a list of the screening recommended for you and due dates:  Health Maintenance  Topic Date Due   Hepatitis C Screening  Never done   DTaP/Tdap/Td vaccine (1 - Tdap) Never done   COVID-19 Vaccine (8 - 2024-25 season) 06/30/2023   Flu Shot  05/29/2024   Medicare Annual Wellness Visit  03/03/2025   Mammogram  06/12/2025   Cologuard (Stool DNA test)  02/04/2027   Pneumonia Vaccine  Completed   DEXA scan (bone density measurement)  Completed   Zoster (Shingles) Vaccine  Completed   HPV Vaccine  Aged Out   Meningitis B Vaccine  Aged Out    Advanced directives: (Declined) Advance directive discussed with you today. Even though you declined this today, please call our office should you change your mind, and we can give you the proper paperwork for you to fill out.  Next Medicare Annual Wellness Visit scheduled for next year: Yes

## 2024-03-04 ENCOUNTER — Ambulatory Visit (INDEPENDENT_AMBULATORY_CARE_PROVIDER_SITE_OTHER): Payer: Medicare Other | Admitting: Internal Medicine

## 2024-03-04 ENCOUNTER — Encounter: Payer: Self-pay | Admitting: Internal Medicine

## 2024-03-04 VITALS — BP 114/80 | HR 85 | Temp 97.7°F | Ht 66.5 in | Wt 151.0 lb

## 2024-03-04 DIAGNOSIS — E2749 Other adrenocortical insufficiency: Secondary | ICD-10-CM | POA: Diagnosis not present

## 2024-03-04 DIAGNOSIS — E893 Postprocedural hypopituitarism: Secondary | ICD-10-CM

## 2024-03-04 DIAGNOSIS — E038 Other specified hypothyroidism: Secondary | ICD-10-CM

## 2024-03-04 DIAGNOSIS — E230 Hypopituitarism: Secondary | ICD-10-CM | POA: Diagnosis not present

## 2024-03-04 DIAGNOSIS — Z0000 Encounter for general adult medical examination without abnormal findings: Secondary | ICD-10-CM | POA: Diagnosis not present

## 2024-03-04 NOTE — Progress Notes (Signed)
   Subjective:   Patient ID: Rebecca Trevino, female    DOB: 08-06-1956, 68 y.o.   MRN: [id number]  HPI The patient is here for physical.  PMH, [facility], social history reviewed and updated  Review of Systems  Constitutional: Negative.   HENT: Negative.    Eyes: Negative.   Respiratory:  Negative for cough, chest tightness and shortness of breath.   Cardiovascular:  Negative for chest pain, palpitations and leg swelling.  Gastrointestinal:  Negative for abdominal distention, abdominal pain, constipation, diarrhea, nausea and vomiting.  Musculoskeletal: Negative.   Skin: Negative.   Neurological: Negative.   Psychiatric/Behavioral: Negative.      Objective:  Physical Exam Constitutional:      Appearance: She is well-developed.  HENT:     Head: Normocephalic and atraumatic.  Cardiovascular:     Rate and Rhythm: Normal rate and regular rhythm.  Pulmonary:     Effort: Pulmonary effort is normal. No respiratory distress.     Breath sounds: Normal breath sounds. No wheezing or rales.  Abdominal:     General: Bowel sounds are normal. There is no distension.     Palpations: Abdomen is soft.     Tenderness: There is no abdominal tenderness. There is no rebound.  Musculoskeletal:     Cervical back: Normal range of motion.  Skin:    General: Skin is warm and dry.  Neurological:     Mental Status: She is alert and oriented to person, place, and time.     Coordination: Coordination normal.     Vitals:   03/04/24 1024  BP: 114/80  Pulse: 85  Temp: 97.7 F (36.5 C)  TempSrc: Oral  SpO2: 97%  Weight: 151 lb (68.5 kg)  Height: 5' 6.5" (1.689 m)    Assessment & Plan:

## 2024-03-05 NOTE — Telephone Encounter (Signed)
 I have abstracted this and last one patient had done was back in 2022

## 2024-03-06 NOTE — Assessment & Plan Note (Signed)
 Gets monitoring for hormonal levels.

## 2024-03-06 NOTE — Assessment & Plan Note (Signed)
 Seeing endo for monitoring and up to date.

## 2024-03-06 NOTE — Assessment & Plan Note (Signed)
Flu shot yearly. Pneumonia complete. Shingrix complete. Tetanus up to date. Colonoscopy up to date. Mammogram up to date, pap smear aged out and dexa complete. Counseled about sun safety and mole surveillance. Counseled about the dangers of distracted driving. Given 10 year screening recommendations.   

## 2024-03-06 NOTE — Assessment & Plan Note (Signed)
 Gets monitoring through endo and on thyroid  replacement currently.

## 2024-03-06 NOTE — Assessment & Plan Note (Signed)
 Continues with endo and monitoring on levothyroxine  and off steroids at this time.

## 2024-04-04 ENCOUNTER — Encounter: Payer: Self-pay | Admitting: Internal Medicine

## 2024-04-08 ENCOUNTER — Encounter: Payer: Self-pay | Admitting: Internal Medicine

## 2024-04-08 ENCOUNTER — Other Ambulatory Visit: Payer: Self-pay

## 2024-04-08 MED ORDER — FAMOTIDINE 20 MG PO TABS: 20.0000 mg | ORAL_TABLET | Freq: Every day | ORAL | 3 refills | Status: DC

## 2024-04-21 ENCOUNTER — Ambulatory Visit (INDEPENDENT_AMBULATORY_CARE_PROVIDER_SITE_OTHER): Admitting: Internal Medicine

## 2024-04-21 ENCOUNTER — Encounter: Payer: Self-pay | Admitting: Internal Medicine

## 2024-04-21 VITALS — BP 118/80 | HR 71 | Temp 98.3°F | Wt 153.0 lb

## 2024-04-21 DIAGNOSIS — B9689 Other specified bacterial agents as the cause of diseases classified elsewhere: Secondary | ICD-10-CM | POA: Diagnosis not present

## 2024-04-21 DIAGNOSIS — J208 Acute bronchitis due to other specified organisms: Secondary | ICD-10-CM | POA: Diagnosis not present

## 2024-04-21 MED ORDER — BENZONATATE 200 MG PO CAPS
200.0000 mg | ORAL_CAPSULE | Freq: Three times a day (TID) | ORAL | 0 refills | Status: DC | PRN
Start: 2024-04-21 — End: 2024-08-19

## 2024-04-21 MED ORDER — DOXYCYCLINE HYCLATE 100 MG PO TABS: 100.0000 mg | ORAL_TABLET | Freq: Two times a day (BID) | ORAL | 0 refills | Status: DC

## 2024-04-21 NOTE — Patient Instructions (Signed)
 We have sent in doxycycline to take 1 pill twice a day for 1 week and cough medicine as well

## 2024-04-21 NOTE — Assessment & Plan Note (Signed)
 Rx doxycycline and tessalon perles. Cough and rhonchi with progression over 7-10 day period. If no improvement needs CXR.

## 2024-04-21 NOTE — Progress Notes (Signed)
   Subjective:   Patient ID: Rebecca Trevino, female    DOB: [birth year], 68 y.o.   MRN: [id number]  HPI The patient is a 68 YO female coming in for cough for 7-10 days. She is coughing up sputum. Husband with same and treated with abx and got better. Denies fevers or chills. Some SOB. Overall worsening.   Review of Systems  Constitutional:  Positive for activity change and appetite change. Negative for chills, fatigue, fever and unexpected weight change.  HENT:  Positive for congestion, postnasal drip, rhinorrhea and sinus pressure. Negative for ear discharge, ear pain, sinus pain, sneezing, sore throat, tinnitus, trouble swallowing and voice change.   Eyes: Negative.   Respiratory:  Positive for cough and shortness of breath. Negative for chest tightness and wheezing.   Cardiovascular: Negative.   Gastrointestinal: Negative.   Musculoskeletal:  Positive for myalgias.  Neurological: Negative.     Objective:  Physical Exam Constitutional:      Appearance: She is well-developed.  HENT:     Head: Normocephalic and atraumatic.     Comments: Oropharynx with redness and clear drainage, nose with swollen turbinates, TMs normal bilaterally.  Neck:     Thyroid : No thyromegaly.   Cardiovascular:     Rate and Rhythm: Normal rate and regular rhythm.  Pulmonary:     Effort: Pulmonary effort is normal. No respiratory distress.     Breath sounds: Rhonchi present. No wheezing or rales.  Abdominal:     Palpations: Abdomen is soft.   Musculoskeletal:        General: Tenderness present.     Cervical back: Normal range of motion.  Lymphadenopathy:     Cervical: No cervical adenopathy.   Skin:    General: Skin is warm and dry.   Neurological:     Mental Status: She is alert and oriented to person, place, and time.     Vitals:   04/21/24 1400  BP: 118/80  Pulse: 71  Temp: 98.3 F (36.8 C)  TempSrc: Oral  SpO2: 98%  Weight: 153 lb (69.4 kg)    Assessment & Plan:

## 2024-04-22 ENCOUNTER — Telehealth: Payer: Self-pay

## 2024-04-22 ENCOUNTER — Ambulatory Visit: Payer: Self-pay

## 2024-04-22 MED ORDER — AZITHROMYCIN 250 MG PO TABS: ORAL_TABLET | ORAL | 1 refills | Status: AC

## 2024-04-22 NOTE — Telephone Encounter (Signed)
 FYI Only or Action Required?: Action required by provider: medication refill request.  Patient was last seen in primary care on 04/21/2024 by Rollene Almarie LABOR, MD. Called Nurse Triage reporting Rash. Symptoms began yesterday. Interventions attempted: OTC medications: benadryl. Symptoms are: unchanged.  Triage Disposition: See Physician Within 24 Hours  Patient/caregiver understands and will follow disposition?: Yes    Reason for Triage: reaction from medication doxycycline (VIBRA-TABS) 100 MG tablet,causing itching ,rashes on stomach,arms and knees     Reason for Disposition  Taking new prescription antibiotic  (Exception: Finished taking new prescription antibiotic.)    Will route encounter to PCP office - to request different ABX for pt and/or plan of care moving forward.  Answer Assessment - Initial Assessment Questions 1. APPEARANCE of RASH: Describe the rash. (e.g., spots, blisters, raised areas, skin peeling, scaly)     Rash, whelps, red and itchy 2. SIZE: How big are the spots? (e.g., tip of pen, eraser, coin; inches, centimeters)     N/a 3. LOCATION: Where is the rash located?     Stomach, arms and legs 4. COLOR: What color is the rash? (Note: It is difficult to assess rash color in people with darker-colored skin. When this situation occurs, simply ask the caller to describe what they see.)     red 5. ONSET: When did the rash begin?     yesterday 6. FEVER: Do you have a fever? If Yes, ask: What is your temperature, how was it measured, and when did it start?     no 7. ITCHING: Does the rash itch? If Yes, ask: How bad is the itch? (Scale 1-10; or mild, moderate, severe)     Mild to moderate 8. CAUSE: What do you think is causing the rash?     Right after taking medication 9. NEW MEDICINES: What new medicines are you taking? (e.g., name of antibiotic) When did you start taking this medication?.     Doxycycline  10. OTHER SYMPTOMS: Do you have  any other symptoms? (e.g., sore throat, fever, joint pain)       N/a 11. PREGNANCY: Is there any chance you are pregnant? When was your last menstrual period?       N/a  Pt states: 1/2  hour after taking medication - rash & whelps to stomach, arms & legs.  Rash goes away after 1.5 hours.  Pt would like a different ABX called in if possible.  Please call pt to inform of plan of care  Protocols used: Rash - Widespread On Drugs-A-AH

## 2024-04-22 NOTE — Telephone Encounter (Signed)
 Ok this was changed to zpack

## 2024-04-23 NOTE — Telephone Encounter (Signed)
 Spoke with patient yesterday afternoon and patent was advised to stop taking the doxycyline and convert to z-pack

## 2024-04-27 NOTE — Telephone Encounter (Signed)
 ERROR

## 2024-05-10 ENCOUNTER — Encounter: Payer: Self-pay | Admitting: Dermatology

## 2024-05-15 ENCOUNTER — Encounter: Payer: Self-pay | Admitting: Internal Medicine

## 2024-05-20 ENCOUNTER — Other Ambulatory Visit: Payer: Self-pay | Admitting: Family

## 2024-05-20 MED ORDER — PREDNISONE 20 MG PO TABS: 40.0000 mg | ORAL_TABLET | Freq: Every day | ORAL | 0 refills | Status: DC

## 2024-05-21 NOTE — Telephone Encounter (Signed)
**Note De-identified  [place] Obfuscation** Please advise 

## 2024-05-23 ENCOUNTER — Other Ambulatory Visit: Payer: Self-pay | Admitting: Internal Medicine

## 2024-05-23 ENCOUNTER — Encounter: Payer: Self-pay | Admitting: Internal Medicine

## 2024-05-28 ENCOUNTER — Other Ambulatory Visit: Payer: Self-pay

## 2024-05-28 MED ORDER — HYDROXYZINE HCL 25 MG PO TABS: 25.0000 mg | ORAL_TABLET | Freq: Every evening | ORAL | 3 refills | Status: AC | PRN

## 2024-05-28 MED ORDER — FLUTICASONE PROPIONATE 50 MCG/ACT NA SUSP: 2.0000 | Freq: Every day | NASAL | 0 refills | Status: DC

## 2024-06-13 ENCOUNTER — Other Ambulatory Visit: Payer: Self-pay | Admitting: Internal Medicine

## 2024-06-13 ENCOUNTER — Encounter: Payer: Self-pay | Admitting: Internal Medicine

## 2024-06-25 ENCOUNTER — Ambulatory Visit (HOSPITAL_BASED_OUTPATIENT_CLINIC_OR_DEPARTMENT_OTHER): Admitting: Orthopaedic Surgery

## 2024-08-03 ENCOUNTER — Other Ambulatory Visit: Payer: Self-pay | Admitting: Internal Medicine

## 2024-08-03 ENCOUNTER — Encounter: Payer: Self-pay | Admitting: Dermatology

## 2024-08-03 ENCOUNTER — Encounter: Payer: Self-pay | Admitting: Internal Medicine

## 2024-08-03 ENCOUNTER — Ambulatory Visit: Admitting: Dermatology

## 2024-08-03 VITALS — BP 99/69

## 2024-08-03 DIAGNOSIS — D1801 Hemangioma of skin and subcutaneous tissue: Secondary | ICD-10-CM

## 2024-08-03 DIAGNOSIS — E038 Other specified hypothyroidism: Secondary | ICD-10-CM

## 2024-08-03 DIAGNOSIS — L659 Nonscarring hair loss, unspecified: Secondary | ICD-10-CM

## 2024-08-03 MED ORDER — MINOXIDIL 2.5 MG PO TABS: 1.2500 mg | ORAL_TABLET | Freq: Every day | ORAL | 1 refills | Status: DC

## 2024-08-03 NOTE — Patient Instructions (Signed)

## 2024-08-03 NOTE — Progress Notes (Signed)
   Follow-Up Visit   Subjective  Rebecca Trevino is a 68 y.o. female who presents for the following: Alopecia follow up - she stopped using minoxidil solution due to itching of her scalp. She is still taking Viviscal 2 tablets daily and that seems to be working. She first noticed hair thinning after she was treated for a pituitary gland tumor 3 years ago.  She has a spot on her left arm that she would like checked. It has been there for months and does not not completely go away. Sometimes (about once a month) it looks like a blister then it scabs and turns into a red mark.  Accompanied by Rebecca Trevino today   The following portions of the chart were reviewed this encounter and updated as appropriate: medications, allergies, medical history  Review of Systems:  No other skin or systemic complaints except as noted in HPI or Assessment and Plan.  Objective  Well appearing patient in no apparent distress; mood and affect are within normal limits.   A focused examination was performed of the following areas: scalp, left arm   Relevant exam findings are noted in the Assessment and Plan          Assessment & Plan    Female pattern hair loss  Hair thinning since December with initial improvement on topical minoxidil, discontinued due to irritation and itching. Hair is currently well-managed but slightly thin. Pituitary gland tumor and associated hormonal changes may have contributed to hair loss. Oral minoxidil considered as an alternative treatment.  - Prescribe oral minoxidil 1.25 mg daily (half of a 2.5 mg tablet) - Instruct to take the medication with dinner to minimize potential side effects - Monitor for side effects such as lightheadedness, dizziness, and heart palpitations - Check blood pressure at home a few times a week - Follow up in 4-6 months to assess hair regrowth and adjust dosage if necessary  Doses of oral minoxidil for hair loss are considered 'low dose'. This is  because the doses used for hair loss are much lower than the doses which are used for conditions such as high blood pressure (hypertension). The doses used for hypertension are 10-40mg  per day.  Side effects are uncommon at the low doses (up to 2.5 mg/day) used to treat hair loss. Potential side effects, more commonly seen at higher doses, include: Increase in hair growth (hypertrichosis) elsewhere on face and body Temporary hair shedding upon starting medication which may last up to 4 weeks Ankle swelling, fluid retention, rapid weight gain more than 5 pounds Low blood pressure and feeling lightheaded or dizzy when standing up quickly Fast or irregular heartbeat Headaches   Cherry angioma, left arm Recurrent lesion on the left arm, appearing as a cherry angioma. Lesion flares up monthly, blisters, and then subsides. Currently in a quiet stage. No signs of skin cancer under magnification. Differential diagnosis includes benign vascular lesion.  - Monitor the lesion for changes in size or symptoms - Consider biopsy if the lesion grows or becomes problematic   Return in about 6 months (around 02/01/2025) for Alopecia.  I, Roseline Hutchinson, CMA, am acting as scribe for [employer], DO .   Documentation: I have reviewed the above documentation for accuracy and completeness, and I agree with the above.  Delon Lenis, DO

## 2024-08-04 ENCOUNTER — Encounter: Payer: Self-pay | Admitting: Internal Medicine

## 2024-08-11 ENCOUNTER — Other Ambulatory Visit

## 2024-08-11 DIAGNOSIS — E038 Other specified hypothyroidism: Secondary | ICD-10-CM | POA: Diagnosis not present

## 2024-08-12 ENCOUNTER — Ambulatory Visit: Payer: Self-pay | Admitting: Internal Medicine

## 2024-08-12 LAB — T4, FREE: Free T4: 1.3 ng/dL (ref 0.8–1.8)

## 2024-08-12 LAB — TSH: TSH: 1.7 m[IU]/L (ref 0.40–4.50)

## 2024-08-14 ENCOUNTER — Other Ambulatory Visit: Payer: Self-pay | Admitting: Medical Genetics

## 2024-08-14 DIAGNOSIS — Z006 Encounter for examination for normal comparison and control in clinical research program: Secondary | ICD-10-CM

## 2024-08-19 ENCOUNTER — Ambulatory Visit: Payer: Medicare Other | Admitting: Internal Medicine

## 2024-08-19 ENCOUNTER — Encounter: Payer: Self-pay | Admitting: Internal Medicine

## 2024-08-19 VITALS — BP 128/70 | HR 76 | Ht 66.5 in | Wt 154.0 lb

## 2024-08-19 DIAGNOSIS — E038 Other specified hypothyroidism: Secondary | ICD-10-CM

## 2024-08-19 DIAGNOSIS — E893 Postprocedural hypopituitarism: Secondary | ICD-10-CM

## 2024-08-19 MED ORDER — LEVOTHYROXINE SODIUM 50 MCG PO TABS: 50.0000 ug | ORAL_TABLET | Freq: Every day | ORAL | 3 refills | Status: AC

## 2024-08-19 NOTE — Progress Notes (Signed)
 Name: Rebecca Trevino  MRN/ DOB: [id number], 1956-09-28    Age/ Sex: 68 y.o., female     PCP: Rollene Almarie LABOR, MD   Reason for Endocrinology Evaluation: hypopituitarism     Initial Endocrinology Clinic Visit: 11/24/2020    PATIENT IDENTIFIER: Rebecca Trevino is a 68 y.o., female with a past medical history of S/P pituitary resection and GERD. She has followed with [facility] since 11/24/2020 for consultative assistance with management of her hypopituitarism.   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 Post-op MRI showed enlarged sella with right residual pituitary noted on MRI in 11/2020 and 02/2022   Preoperatively the patient had a low ACTH  at 4.6 PG/mL (7.2-63.3) with a cortisol level of 1.4 UG/DL, LH and FSH inappropriately normal at 0.9 and 7.7 m IU/mL respectively.  Prolactin normal at 20 ng/mL , no IGF-1 but was noted with low somatomedin C.  She was also noted to have a normal TSH at 0.488 u IU/mL with low free T4 at 0.55 ng/dL   She also had hyponatremia 115 mEq but this responded well with fluid restriction    By 07/2022 her ACTH  has normalized, we switched from prednisone  to hydrocortisone   By 10/2022 ACTH  continue to improve at  17 PG/mL, fasting cortisol with holding hydrocortisone  prior night was 8.8 UG/DL.  Patient advised to wean off hydrocortisone  which was discontinued around February 2024   Cosyntropin  stimulation test was normal 01/2023 with 30-minute cortisol 20.3 UG/dL    SUBJECTIVE:    Today (08/19/2024):  [name] is here for a follow up on hypopituitarism.     She has been off Hydrocortisone  since 11/2022  Today she is accompanied by her spouse Weight overall stable over the past year No dizziness  Had recent headaches due to sinus  No changes in vision  Up to date on eye exam ~April/May No nausea or vomiting  No constipation or diarrhea  No  palpitations  Nocturia ~ 1-2 x  She is on oral  minoxidil for hair loss through dermatology  Levothyroxine  50 mcg daily        HISTORY:  Past Medical History:  Past Medical History:  Diagnosis Date   Allergy 1965   Asthma 1965   GERD (gastroesophageal reflux disease) 2016   High blood pressure    Past Surgical History:  Past Surgical History:  Procedure Laterality Date   BRAIN SURGERY  09/2020   TRANSPHENOIDAL APPROACH EXPOSURE N/A 10/10/2020   Procedure: ENDONASAL ENDOSCOPIC TRANSPHENOIDAL TUMOR RESECTION;  Surgeon: Cheryle Debby LABOR, MD;  Location: [facility] OR;  Service: Neurosurgery;  Laterality: N/A;   Social History:  reports that she quit smoking about 22 years ago. Her smoking use included cigarettes. She has a 5 pack-year smoking history. She has never been exposed to tobacco smoke. She has never used smokeless tobacco. She reports current alcohol use of about 3.0 standard drinks of alcohol per week. She reports that she does not use drugs. Family History:  Family History  Problem Relation Age of Onset   Heart disease Mother    Arthritis Father    Cancer Father    Asthma Brother    Breast cancer Neg Hx      HOME MEDICATIONS: Allergies as of 08/19/2024       Reactions   Codeine Nausea And Vomiting   Erythromycin Nausea And Vomiting        Medication List  Accurate as of August 19, 2024 10:21 AM. If you have any questions, ask your nurse or doctor.          benzonatate  200 MG capsule Commonly known as: TESSALON  Take 1 capsule (200 mg total) by mouth 3 (three) times daily as needed.   CALCIUM 1200+D3 PO   doxycycline  100 MG tablet Commonly known as: VIBRA -TABS Take 1 tablet (100 mg total) by mouth 2 (two) times daily.   famotidine  20 MG tablet Commonly known as: Pepcid  Take 1 tablet (20 mg total) by mouth daily.   fluticasone  50 MCG/ACT nasal spray Commonly known as: FLONASE  USE 2 SPRAYS IN BOTH NOSTRILS  DAILY   hydrOXYzine  25 MG  tablet Commonly known as: ATARAX  Take 1 tablet (25 mg total) by mouth at bedtime as needed. for sleep   levothyroxine  50 MCG tablet Commonly known as: SYNTHROID  TAKE 1 TABLET BY MOUTH DAILY   Magnesium  500 MG Tabs Take 500 mg by mouth daily.   minoxidil 2.5 MG tablet Commonly known as: LONITEN Take 0.5 tablets (1.25 mg total) by mouth daily.   multivitamin with minerals tablet Take 1 tablet by mouth daily.   ondansetron  4 MG tablet Commonly known as: ZOFRAN  Take 1 tablet (4 mg total) by mouth as needed for nausea or vomiting.   predniSONE  20 MG tablet Commonly known as: DELTASONE  Take 2 tablets (40 mg total) by mouth daily with breakfast.   Safety Seal Miscellaneous Misc 1 Application by Does not apply route every morning. Medication name: Minoxidil solution   Ventolin  HFA 108 (90 Base) MCG/ACT inhaler Generic drug: albuterol  USE 2 INHALATIONS BY MOUTH EVERY 6 HOURS AS NEEDED FOR WHEEZING  OR SHORTNESS OF BREATH          OBJECTIVE:   PHYSICAL EXAM: VS: BP 128/70 (BP Location: Left Arm, Patient Position: Sitting, Cuff Size: Normal)   Pulse 76   Ht 5' 6.5 (1.689 m)   Wt 154 lb (69.9 kg)   SpO2 98%   BMI 24.48 kg/m     EXAM: General: Pt appears well and is in NAD  Neck: General: Supple without adenopathy. Thyroid : Thyroid  size normal.  No goiter or nodules appreciated.  Lungs: Clear with good BS bilat   Heart: Auscultation: RRR.  Abdomen: soft, nontender  Extremities:  BL LE: No pretibial edema  Mental Status: Judgment, insight: Intact Orientation: Oriented to time, place, and person Mood and affect: No depression, anxiety, or agitation     DATA REVIEWED:  Latest Reference Range & Units 08/11/24 10:43  TSH 0.40 - 4.50 mIU/L 1.70  T4,Free(Direct) 0.8 - 1.8 ng/dL 1.3     Latest Reference Range & Units 02/20/23 10:55 02/20/23 11:35 02/20/23 11:56  Cortisol, Plasma ug/dL 89.3 79.6 80.2     Latest Reference Range & Units 02/28/24 10:05  Sodium 135  - 145 mEq/L 137  Potassium 3.5 - 5.1 mEq/L 4.0  Chloride 96 - 112 mEq/L 103  CO2 19 - 32 mEq/L 29  Glucose 70 - 99 mg/dL 94  BUN 6 - 23 mg/dL 16  Creatinine 9.59 - 8.79 mg/dL 9.17  Calcium 8.4 - 89.4 mg/dL 9.4  Alkaline Phosphatase 39 - 117 U/L 72  Albumin 3.5 - 5.2 g/dL 4.2  AST 0 - 37 U/L 20  ALT 0 - 35 U/L 16  Total Protein 6.0 - 8.3 g/dL 7.1  Total Bilirubin 0.2 - 1.2 mg/dL 0.6  GFR >39.99 mL/min 72.67      MRI 03/09/2022  FINDINGS: Brain: Prior trans-sphenoidal resection  of pituitary tumor. The sella remains enlarged and unchanged. No recurrent tumor. Infundibulum mildly deviated to the right unchanged. Small amount of enhancing tissue in the right sella may represent residual pituitary which is unchanged. Optic chiasm normal. Cavernous sinus normal bilaterally.   Ventricle size normal. No acute infarct or hemorrhage. Mild chronic microvascular ischemic changes in the white matter are stable.   Vascular: Normal arterial flow voids at the skull base   Skull and upper cervical spine: No focal abnormality.   Sinuses/Orbits: Mucosal edema in the sphenoid sinus due to prior trans-sphenoidal surgery.   Other: None   IMPRESSION: Stable MRI of the brain and pituitary. Postop changes of pituitary resection. No recurrent tumor.   Mild chronic microvascular ischemic change in the white matter stable.    ASSESSMENT / PLAN / RECOMMENDATIONS:   S/P Transphenoidal  hypophysectomy:   -Her surgery was December 2021 due to pituitary apoplexy -She had required levothyroxine  and hydrocortisone  prior to the surgery -She has been off hydrocortisone  since February 2024 with normal cosyntropin  stimulation test. -MRI May 2023 showed stable postop pituitary changes - No local symptoms - We discussed repeating MRI next year   2. Secondary Hypothyroidism:  -Patient is clinically euthyroid -TFTs are within normal range, no change - In the past she has questioned whether she  needs to be on levothyroxine  long-term or not, today I did entertain the idea of taking half a tablet of levothyroxine  and rechecking TFTs, but after discussing symptoms of hypothyroid, patient opted to remain on current dose  Medication Continue levothyroxine  50 mcg daily  Follow-up in 1 year  Signed electronically by: Stefano Redgie Butts, MD  [facility]  [facility] [address] [place] [place], [place] [address] Phone: [phone] FAX: [phone]      CC: Rollene Almarie LABOR, MD [address] [place] [address] Phone: [phone]  Fax: [phone]   Return to Endocrinology clinic as below: Future Appointments  Date Time Provider Department Center  02/02/2025  9:45 AM Alm Delon SAILOR, DO [facility] None  03/03/2025 10:30 AM [facility]  03/08/2025 10:10 AM [facility]-ANNUAL WELLNESS VISIT [facility]  03/08/2025 11:00 AM Rollene Almarie LABOR, MD [facility]

## 2024-08-19 NOTE — Patient Instructions (Signed)

## 2024-08-27 NOTE — Addendum Note (Signed)
 Addended by: REMUS NOEMI PARAS on: 08/27/2024 10:47 AM   Modules accepted: Orders

## 2024-08-31 ENCOUNTER — Encounter: Payer: Self-pay | Admitting: Radiology

## 2024-09-22 ENCOUNTER — Ambulatory Visit: Admitting: Internal Medicine

## 2024-09-22 ENCOUNTER — Encounter: Payer: Self-pay | Admitting: Internal Medicine

## 2024-09-22 VITALS — BP 130/80 | HR 74 | Temp 97.9°F | Ht 66.5 in | Wt 153.4 lb

## 2024-09-22 DIAGNOSIS — M25572 Pain in left ankle and joints of left foot: Secondary | ICD-10-CM

## 2024-09-22 DIAGNOSIS — R0981 Nasal congestion: Secondary | ICD-10-CM | POA: Diagnosis not present

## 2024-09-22 NOTE — Assessment & Plan Note (Signed)
 She experiences seasonal allergic rhinitis with sinus pressure and headache, more pronounced in autumn and spring. Current management with Claritin and Flonase  is insufficient. Continue these medications, consider Afrin for temporary relief, and explore sinus rinses.She is using otc cold medication which she is okay to continue while monitoring BP.

## 2024-09-22 NOTE — Progress Notes (Signed)
 Subjective:   Patient ID: Rebecca Trevino, female    DOB: 11-01-1955, 68 y.o.   MRN: [id number]  Discussed the use of AI scribe software for clinical note transcription with the patient, who gave verbal consent to proceed.  History of Present Illness Rebecca CODERRE is a 68 year old female who presents with left ankle swelling and dizziness.  She has been experiencing swelling in her left ankle following a misstep. Initially, the swelling improved but recurred after she twisted it again. She manages the condition by wrapping the ankle, although she did not do so yesterday to observe if swelling would occur. The swelling has been improving, though it still comes and goes. Pain is minimal unless she stands for prolonged periods, where it radiates from the ankle to the heel with a throbbing sensation. She uses ibuprofen for pain relief and has tried bandaging the ankle. Climbing stairs remains challenging, though it has become easier over time.  She experiences dizziness, particularly when changing positions while lying down. This is accompanied by sinus pressure, especially during the fall and spring seasons, which she attributes to allergies. She uses over-the-counter sinus and allergy medications, including children's Claritin and Flonase , but finds them insufficient for her current symptoms. She reports a headache over her eyes and dizziness, which she associates with sinus pressure. She has a history of hives, which are managed with Claritin. No nasal congestion noted.     Review of Systems  Constitutional: Negative.   HENT:  Positive for congestion.   Eyes: Negative.   Respiratory:  Negative for cough, chest tightness and shortness of breath.   Cardiovascular:  Negative for chest pain, palpitations and leg swelling.  Gastrointestinal:  Negative for abdominal distention, abdominal pain, constipation, diarrhea, nausea and vomiting.  Musculoskeletal:  Positive for joint swelling and myalgias.   Skin: Negative.   Neurological: Negative.   Psychiatric/Behavioral: Negative.      Objective:  Physical Exam Constitutional:      Appearance: She is well-developed.  HENT:     Head: Normocephalic and atraumatic.  Cardiovascular:     Rate and Rhythm: Normal rate and regular rhythm.  Pulmonary:     Effort: Pulmonary effort is normal. No respiratory distress.     Breath sounds: Normal breath sounds. No wheezing or rales.  Abdominal:     General: Bowel sounds are normal. There is no distension.     Palpations: Abdomen is soft.     Tenderness: There is no abdominal tenderness.  Musculoskeletal:     Cervical back: Normal range of motion.     Comments: Minimal swelling left ankle with mild tenderness to palpation  Skin:    General: Skin is warm and dry.  Neurological:     Mental Status: She is alert and oriented to person, place, and time.     Coordination: Coordination normal.     Vitals:   09/22/24 1003  BP: 130/80  Pulse: 74  Temp: 97.9 F (36.6 C)  TempSrc: Oral  SpO2: 99%  Weight: 153 lb 6.4 oz (69.6 kg)  Height: 5' 6.5 (1.689 m)    Assessment and Plan Assessment & Plan Left ankle tendon strain with intermittent swelling   Intermittent swelling and mild pain in the left ankle follow a twisting injury, with no fracture present. This is likely a tendon strain with inflammation. Full recovery is expected in 6-8 weeks. Elevate the ankle when sitting, use compression socks or wraps, and continue ibuprofen as needed.  Seasonal allergic rhinitis  with sinus pressure and headache   She experiences seasonal allergic rhinitis with sinus pressure and headache, more pronounced in autumn and spring. Current management with Claritin and Flonase  is insufficient. Continue these medications, consider Afrin for temporary relief, and explore sinus rinses.

## 2024-09-22 NOTE — Assessment & Plan Note (Signed)
 Intermittent swelling and mild pain in the left ankle follow a twisting injury, with no fracture present. This is likely a tendon strain with inflammation. Full recovery is expected in 6-8 weeks. Elevate the ankle when sitting, use compression socks or wraps, and continue ibuprofen as needed.

## 2024-09-22 NOTE — Patient Instructions (Signed)
 You can do the compression stockings on the ankle or prop the ankle to help with swelling and within a month this should improve.

## 2024-10-11 ENCOUNTER — Other Ambulatory Visit: Payer: Self-pay | Admitting: Dermatology

## 2024-10-11 DIAGNOSIS — L659 Nonscarring hair loss, unspecified: Secondary | ICD-10-CM

## 2024-10-14 ENCOUNTER — Other Ambulatory Visit: Payer: Self-pay | Admitting: Internal Medicine

## 2024-11-29 ENCOUNTER — Other Ambulatory Visit: Payer: Self-pay | Admitting: Internal Medicine

## 2025-02-02 ENCOUNTER — Ambulatory Visit: Admitting: Dermatology

## 2025-03-03 ENCOUNTER — Other Ambulatory Visit

## 2025-03-08 ENCOUNTER — Ambulatory Visit

## 2025-03-08 ENCOUNTER — Encounter: Admitting: Internal Medicine

## 2025-08-20 ENCOUNTER — Ambulatory Visit: Admitting: Internal Medicine
